# Patient Record
Sex: Female | Born: 1985 | Race: White | Hispanic: No | Marital: Single | Smoking: Never smoker
Health system: Southern US, Community
[De-identification: ages and names within clinical notes are randomized; demographics above are authoritative.]

## PROBLEM LIST (undated history)

## (undated) DIAGNOSIS — C259 Malignant neoplasm of pancreas, unspecified: Secondary | ICD-10-CM

## (undated) HISTORY — PX: HERNIA REPAIR: SHX51

## (undated) HISTORY — PX: WHIPPLE PROCEDURE: SHX2667

---
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
## 2011-07-30 ENCOUNTER — Other Ambulatory Visit: Payer: Self-pay

## 2011-08-29 ENCOUNTER — Other Ambulatory Visit (HOSPITAL_COMMUNITY): Payer: Self-pay | Admitting: Obstetrics and Gynecology

## 2011-08-29 DIAGNOSIS — Z3682 Encounter for antenatal screening for nuchal translucency: Secondary | ICD-10-CM

## 2011-09-19 ENCOUNTER — Other Ambulatory Visit (HOSPITAL_COMMUNITY): Payer: Self-pay

## 2011-09-24 ENCOUNTER — Other Ambulatory Visit (HOSPITAL_COMMUNITY): Payer: Self-pay

## 2011-09-25 ENCOUNTER — Encounter (HOSPITAL_COMMUNITY): Payer: Self-pay

## 2011-09-25 ENCOUNTER — Ambulatory Visit (HOSPITAL_COMMUNITY)
Admission: RE | Admit: 2011-09-25 | Discharge: 2011-09-25 | Disposition: A | Discharge status: Home / Self Care | Payer: Commercial Managed Care - PPO | Source: Ambulatory Visit | Attending: Obstetrics and Gynecology | Admitting: Obstetrics and Gynecology

## 2011-09-25 DIAGNOSIS — O351XX0 Maternal care for (suspected) chromosomal abnormality in fetus, not applicable or unspecified: Secondary | ICD-10-CM | POA: Insufficient documentation

## 2011-09-25 DIAGNOSIS — Z3682 Encounter for antenatal screening for nuchal translucency: Secondary | ICD-10-CM

## 2011-09-25 DIAGNOSIS — O3510X0 Maternal care for (suspected) chromosomal abnormality in fetus, unspecified, not applicable or unspecified: Secondary | ICD-10-CM | POA: Insufficient documentation

## 2011-09-25 DIAGNOSIS — Z3689 Encounter for other specified antenatal screening: Secondary | ICD-10-CM | POA: Insufficient documentation

## 2011-10-03 ENCOUNTER — Other Ambulatory Visit (HOSPITAL_COMMUNITY): Payer: Self-pay | Admitting: Obstetrics and Gynecology

## 2011-10-03 DIAGNOSIS — Z3689 Encounter for other specified antenatal screening: Secondary | ICD-10-CM

## 2011-10-30 ENCOUNTER — Other Ambulatory Visit: Payer: Self-pay

## 2011-11-05 ENCOUNTER — Ambulatory Visit (HOSPITAL_COMMUNITY): Payer: Commercial Managed Care - PPO

## 2011-11-21 ENCOUNTER — Other Ambulatory Visit (HOSPITAL_COMMUNITY): Payer: Self-pay | Admitting: Obstetrics and Gynecology

## 2011-11-21 ENCOUNTER — Ambulatory Visit (HOSPITAL_COMMUNITY)
Admission: RE | Admit: 2011-11-21 | Discharge: 2011-11-21 | Disposition: A | Discharge status: Home / Self Care | Payer: Commercial Managed Care - PPO | Source: Ambulatory Visit | Attending: Obstetrics and Gynecology | Admitting: Obstetrics and Gynecology

## 2011-11-21 DIAGNOSIS — O358XX0 Maternal care for other (suspected) fetal abnormality and damage, not applicable or unspecified: Secondary | ICD-10-CM | POA: Insufficient documentation

## 2011-11-21 DIAGNOSIS — Z1389 Encounter for screening for other disorder: Secondary | ICD-10-CM | POA: Insufficient documentation

## 2011-11-21 DIAGNOSIS — Z3689 Encounter for other specified antenatal screening: Secondary | ICD-10-CM

## 2011-11-21 DIAGNOSIS — Z363 Encounter for antenatal screening for malformations: Secondary | ICD-10-CM | POA: Insufficient documentation

## 2012-03-18 ENCOUNTER — Telehealth (HOSPITAL_COMMUNITY): Payer: Self-pay | Admitting: *Deleted

## 2012-03-18 NOTE — Telephone Encounter (Signed)
MFM pt not an anticipated del at [facility]

## 2012-06-26 ENCOUNTER — Ambulatory Visit
Admission: RE | Admit: 2012-06-26 | Discharge: 2012-06-26 | Disposition: A | Discharge status: Home / Self Care | Payer: No Typology Code available for payment source | Source: Ambulatory Visit | Attending: Infectious Diseases | Admitting: Infectious Diseases

## 2012-06-26 ENCOUNTER — Other Ambulatory Visit: Payer: Self-pay | Admitting: Infectious Diseases

## 2012-06-26 DIAGNOSIS — R7611 Nonspecific reaction to tuberculin skin test without active tuberculosis: Secondary | ICD-10-CM

## 2014-05-18 IMAGING — CR DG CHEST 1V
1 series · 1 of 1 positions shown · non-contrast
Comparison: None.

CLINICAL DATA: Positive PPD

CHEST - 1 VIEW

[view not recorded]
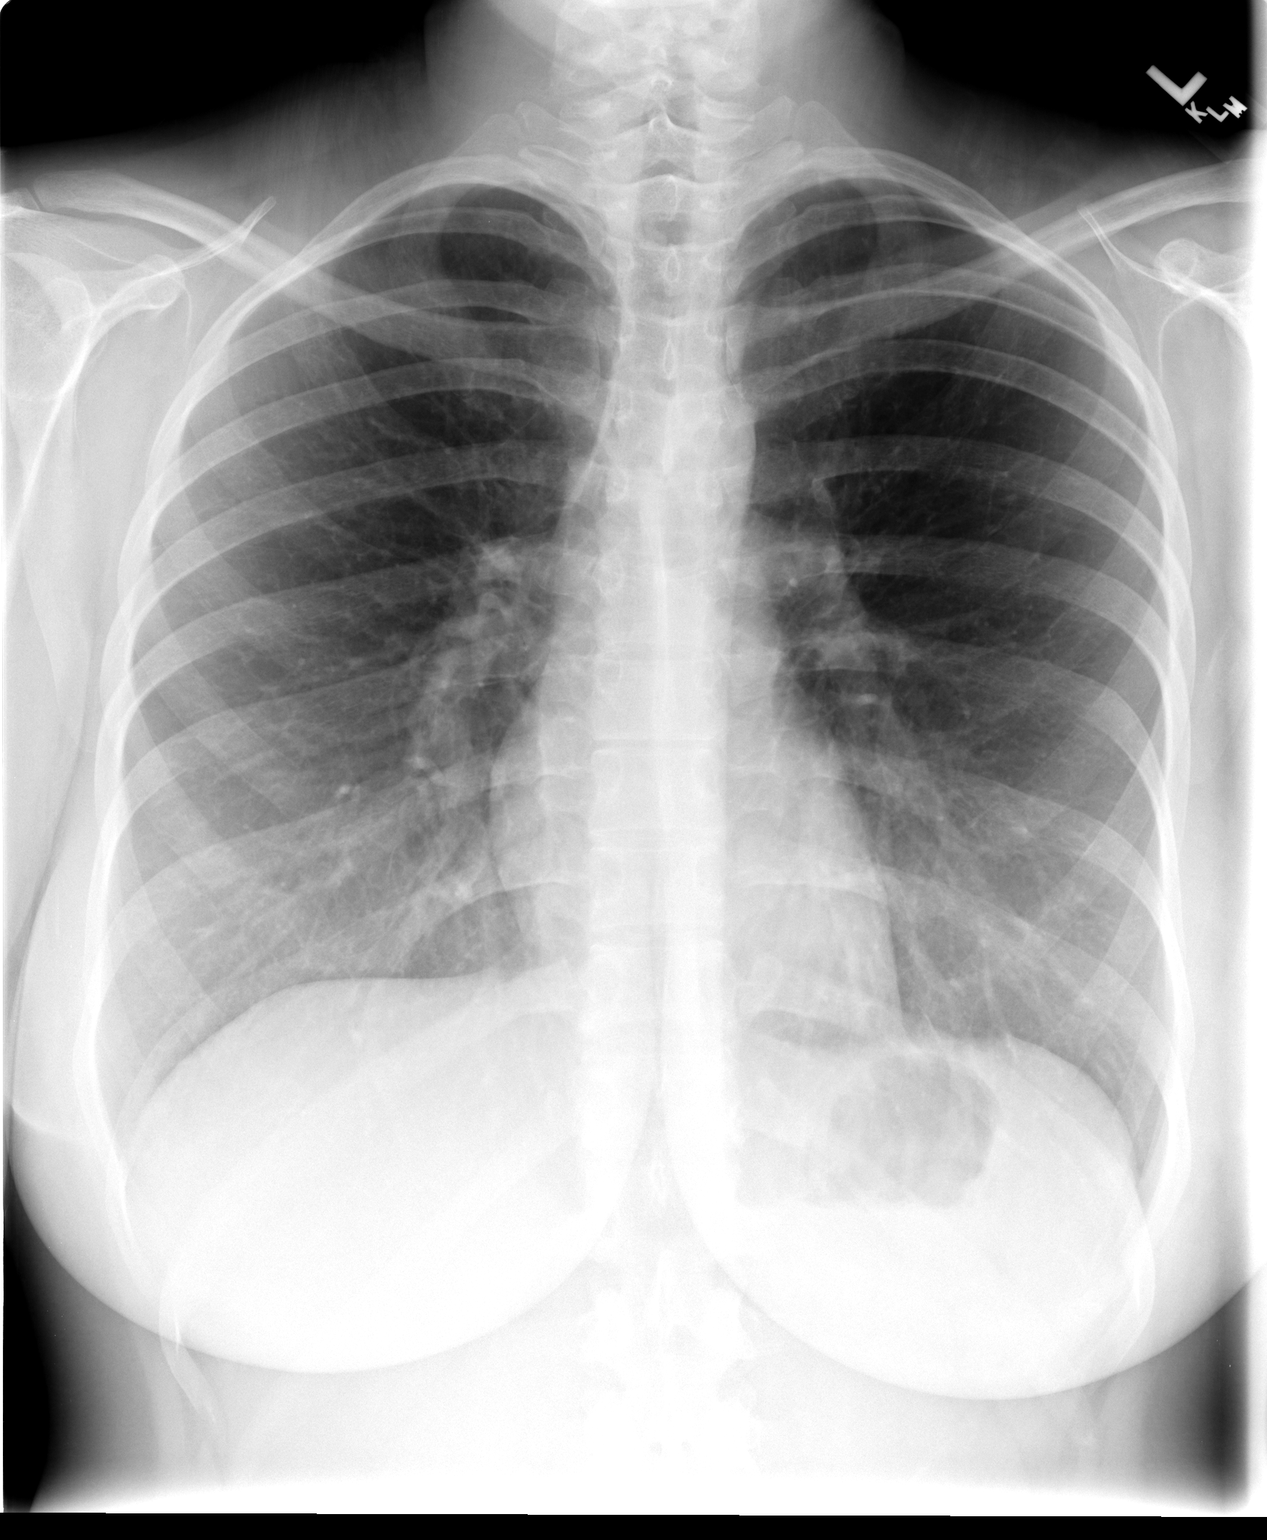

[1 of 1 positions shown; findings below may reference images not displayed]

FINDINGS: The lungs are clear.  No sequela of prior tuberculous
infection is seen.  Mediastinal contours appear normal.  The heart
is within normal limits in size.  No bony abnormality is seen.
IMPRESSION: No active lung disease.

## 2022-02-08 ENCOUNTER — Ambulatory Visit: Admission: EM | Admit: 2022-02-08 | Discharge: 2022-02-08 | Payer: BC Managed Care – PPO

## 2022-02-08 ENCOUNTER — Emergency Department (HOSPITAL_COMMUNITY): Payer: BC Managed Care – PPO

## 2022-02-08 ENCOUNTER — Other Ambulatory Visit: Payer: Self-pay

## 2022-02-08 ENCOUNTER — Emergency Department (HOSPITAL_COMMUNITY)
Admission: EM | Admit: 2022-02-08 | Discharge: 2022-02-08 | Disposition: A | Discharge status: Home / Self Care | Payer: BC Managed Care – PPO | Attending: Emergency Medicine | Admitting: Emergency Medicine

## 2022-02-08 ENCOUNTER — Encounter (HOSPITAL_COMMUNITY): Payer: Self-pay

## 2022-02-08 DIAGNOSIS — R1012 Left upper quadrant pain: Secondary | ICD-10-CM

## 2022-02-08 DIAGNOSIS — R110 Nausea: Secondary | ICD-10-CM | POA: Insufficient documentation

## 2022-02-08 DIAGNOSIS — R1032 Left lower quadrant pain: Secondary | ICD-10-CM | POA: Insufficient documentation

## 2022-02-08 DIAGNOSIS — Z8507 Personal history of malignant neoplasm of pancreas: Secondary | ICD-10-CM | POA: Diagnosis not present

## 2022-02-08 HISTORY — DX: Malignant neoplasm of pancreas, unspecified: C25.9

## 2022-02-08 LAB — URINALYSIS, ROUTINE W REFLEX MICROSCOPIC
Bilirubin Urine: NEGATIVE
Glucose, UA: NEGATIVE mg/dL
Hgb urine dipstick: NEGATIVE
Ketones, ur: NEGATIVE mg/dL
Leukocytes,Ua: NEGATIVE
Nitrite: NEGATIVE
Protein, ur: NEGATIVE mg/dL
Specific Gravity, Urine: 1.017 (ref 1.005–1.030)
pH: 6 (ref 5.0–8.0)

## 2022-02-08 LAB — COMPREHENSIVE METABOLIC PANEL
ALT: 29 U/L (ref 0–44)
AST: 36 U/L (ref 15–41)
Albumin: 4 g/dL (ref 3.5–5.0)
Alkaline Phosphatase: 116 U/L (ref 38–126)
Anion gap: 7 (ref 5–15)
BUN: 9 mg/dL (ref 6–20)
CO2: 26 mmol/L (ref 22–32)
Calcium: 8.7 mg/dL — ABNORMAL LOW (ref 8.9–10.3)
Chloride: 104 mmol/L (ref 98–111)
Creatinine, Ser: 0.54 mg/dL (ref 0.44–1.00)
GFR, Estimated: >60 mL/min (ref >60)
Glucose, Bld: 104 mg/dL — ABNORMAL HIGH (ref 70–99)
Potassium: 4 mmol/L (ref 3.5–5.1)
Sodium: 137 mmol/L (ref 135–145)
Total Bilirubin: 0.4 mg/dL (ref 0.3–1.2)
Total Protein: 7.6 g/dL (ref 6.5–8.1)

## 2022-02-08 LAB — CBC WITH DIFFERENTIAL/PLATELET
Abs Immature Granulocytes: 0.01 10*3/uL (ref 0.00–0.07)
Basophils Absolute: 0.1 10*3/uL (ref 0.0–0.1)
Basophils Relative: 1 %
Eosinophils Absolute: 0.3 10*3/uL (ref 0.0–0.5)
Eosinophils Relative: 5 %
HCT: 40.9 % (ref 36.0–46.0)
Hemoglobin: 12.9 g/dL (ref 12.0–15.0)
Immature Granulocytes: 0 %
Lymphocytes Relative: 35 %
Lymphs Abs: 2.1 10*3/uL (ref 0.7–4.0)
MCH: 26.2 pg (ref 26.0–34.0)
MCHC: 31.5 g/dL (ref 30.0–36.0)
MCV: 83 fL (ref 80.0–100.0)
Monocytes Absolute: 0.6 10*3/uL (ref 0.1–1.0)
Monocytes Relative: 10 %
Neutro Abs: 3 10*3/uL (ref 1.7–7.7)
Neutrophils Relative %: 49 %
Platelets: 290 10*3/uL (ref 150–400)
RBC: 4.93 MIL/uL (ref 3.87–5.11)
RDW: 13.9 % (ref 11.5–15.5)
WBC: 6.1 10*3/uL (ref 4.0–10.5)
nRBC: 0 % (ref 0.0–0.2)

## 2022-02-08 LAB — LIPASE, BLOOD: Lipase: 28 U/L (ref 11–51)

## 2022-02-08 LAB — PREGNANCY, URINE: Preg Test, Ur: NEGATIVE

## 2022-02-08 MED ORDER — IOHEXOL 300 MG/ML  SOLN
100.0000 mL | Freq: Once | INTRAMUSCULAR | Status: AC | PRN
  Administered 2022-02-08: 100 mL via INTRAVENOUS

## 2022-02-08 MED ORDER — LIDOCAINE 5 % EX PTCH
1.0000 | MEDICATED_PATCH | CUTANEOUS | Status: DC
  Administered 2022-02-08: 1 via TRANSDERMAL
  Filled 2022-02-08: qty 1

## 2022-02-08 MED ORDER — SODIUM CHLORIDE 0.9 % IV BOLUS
1000.0000 mL | Freq: Once | INTRAVENOUS | Status: AC
  Administered 2022-02-08: 1000 mL via INTRAVENOUS

## 2022-02-08 MED ORDER — FENTANYL CITRATE PF 50 MCG/ML IJ SOSY
50.0000 ug | PREFILLED_SYRINGE | Freq: Once | INTRAMUSCULAR | Status: AC
  Administered 2022-02-08: 50 ug via INTRAVENOUS
  Filled 2022-02-08: qty 1

## 2022-02-08 NOTE — Discharge Instructions (Addendum)
Your work-up today was reassuring.  Try taking Tylenol and Motrin for the pain.  No signs of an acute abdominal process, follow-up with your primary or the GI doctor in the next 2 weeks your symptoms persist. ?

## 2022-02-08 NOTE — ED Provider Notes (Signed)
?[place] ? ? ? ?CSN: [id number] ?Arrival date & time: 02/08/22  1722 ? ? ?  ? ?History   ?Chief Complaint ?Chief Complaint  ?Patient presents with  ? Abdominal Pain  ? ? ?HPI ?ALEXANDR Rodgers is a 36 y.o. female presenting with left-sided abdominal pain.  History of pancreatic cancer and left-sided abdominal hernia related to surgical incision, previously repaired with mesh.  Describes 4 days of sharp and stabbing left sided abdominal pain, worse in the upper quadrant.  Pain radiates to the umbilicus.  Tolerable at rest but worse with movement.  Denies constipation, but does endorse intermittent nausea and vomiting.  States normal stool and passing gas regularly. ? ?HPI ? ?History reviewed. No pertinent past medical history. ? ?There are no problems to display for this patient. ? ? ?History reviewed. No pertinent surgical history. ? ?OB History   ? ? Gravida  ?1  ? Para  ?0  ? Term  ?0  ? Preterm  ?0  ? AB  ?0  ? Living  ?0  ?  ? ? SAB  ?0  ? IAB  ?0  ? Ectopic  ?0  ? Multiple  ?0  ? Live Births  ?   ?   ?  ?  ? ? ? ?Home Medications   ? ?Prior to Admission medications   ?Medication Sig Start Date End Date Taking? Authorizing Provider  ?Ondansetron HCl (ZOFRAN PO) Take by mouth.      [provider]  ? ? ?Family History ?No family history on file. ? ?Social History ?Social History  ? ?Tobacco Use  ? Smoking status: Never  ?  Passive exposure: Never  ? Smokeless tobacco: Never  ?Vaping Use  ? Vaping Use: Never used  ?Substance Use Topics  ? Alcohol use: Never  ? Drug use: Never  ? ? ? ?Allergies   ?Aspirin, Benadryl [diphenhydramine hcl], Penicillins, and Shellfish-derived products ? ? ?Review of Systems ?Review of Systems  ?Constitutional:  Negative for appetite change, chills, diaphoresis, fever and unexpected weight change.  ?HENT:  Negative for congestion, ear pain, sinus pressure, sinus pain, sneezing, sore throat and trouble swallowing.   ?Respiratory:  Negative for cough, chest tightness  and shortness of breath.   ?Cardiovascular:  Negative for chest pain.  ?Gastrointestinal:  Positive for abdominal pain. Negative for abdominal distention, anal bleeding, blood in stool, constipation, diarrhea, nausea, rectal pain and vomiting.  ?Genitourinary:  Negative for dysuria, flank pain, frequency and urgency.  ?Musculoskeletal:  Negative for back pain and myalgias.  ?Neurological:  Negative for dizziness, light-headedness and headaches.  ?All other systems reviewed and are negative. ? ? ?Physical Exam ?Triage Vital Signs ?ED Triage Vitals  ?Enc Vitals Group  ?   BP 02/08/22 1758 108/74  ?   Pulse Rate 02/08/22 1758 70  ?   Resp 02/08/22 1758 18  ?   Temp 02/08/22 1758 98.4 ?F (36.9 ?C)  ?   Temp Source 02/08/22 1758 Oral  ?   SpO2 02/08/22 1758 98 %  ?   Weight --   ?   Height --   ?   Head Circumference --   ?   Peak Flow --   ?   Pain Score 02/08/22 1756 6  ?   Pain Loc --   ?   Pain Edu? --   ?   Excl. in [place]? --   ? ?No data found. ? ?Updated Vital Signs ?BP 108/74 (BP Location: Right Arm)  Pulse 70   Temp 98.4 ?F (36.9 ?C) (Oral)   Resp 18   LMP 01/30/2022 (Exact Date)   SpO2 98%   Breastfeeding No  ? ?Visual Acuity ?Right Eye Distance:   ?Left Eye Distance:   ?Bilateral Distance:   ? ?Right Eye Near:   ?Left Eye Near:    ?Bilateral Near:    ? ?Physical Exam ?Vitals reviewed.  ?Constitutional:   ?   General: She is not in acute distress. ?   Appearance: Normal appearance. She is not ill-appearing.  ?HENT:  ?   Head: Normocephalic and atraumatic.  ?   Mouth/Throat:  ?   Mouth: Mucous membranes are moist.  ?   Comments: Moist mucous membranes ?Eyes:  ?   Extraocular Movements: Extraocular movements intact.  ?   Pupils: Pupils are equal, round, and reactive to light.  ?Cardiovascular:  ?   Rate and Rhythm: Normal rate and regular rhythm.  ?   Heart sounds: Normal heart sounds.  ?Pulmonary:  ?   Effort: Pulmonary effort is normal.  ?   Breath sounds: Normal breath sounds. No wheezing, rhonchi or  rales.  ?Abdominal:  ?   General: Bowel sounds are normal. There is no distension.  ?   Palpations: Abdomen is soft. There is no mass.  ?   Tenderness: There is abdominal tenderness in the left upper quadrant and left lower quadrant. There is no right CVA tenderness, left CVA tenderness, guarding or rebound.  ?   Comments: Significantly tender to palpation LUQ and LLQ and umbilicus. No obvious mass, hernia. BS positive throughout.   ?Skin: ?   General: Skin is warm.  ?   Capillary Refill: Capillary refill takes less than 2 seconds.  ?   Comments: Good skin turgor  ?Neurological:  ?   General: No focal deficit present.  ?   Mental Status: She is alert and oriented to person, place, and time.  ?Psychiatric:     ?   Mood and Affect: Mood normal.     ?   Behavior: Behavior normal.  ? ? ? ?UC Treatments / Results  ?Labs ?(all labs ordered are listed, but only abnormal results are displayed) ?Labs Reviewed - No data to display ? ?EKG ? ? ?Radiology ?No results found. ? ?Procedures ?Procedures (including critical care time) ? ?Medications Ordered in UC ?Medications - No data to display ? ?Initial Impression / Assessment and Plan / UC Course  ?I have reviewed the triage vital signs and the nursing notes. ? ?Pertinent labs & imaging results that were available during my care of the patient were reviewed by me and considered in my medical decision making (see chart for details). ? ?  ? ?This patient is a very pleasant 36 y.o. year old female presenting with severe left-sided abd pain. Afebrile, nontachy. History of pancreatic cancer s/p Whipple procedure, and hernia repaired with mesh. As we cannot perform abd imaging at this urgent care, sent to ED via personal vehicle.   ? ?Final Clinical Impressions(s) / UC Diagnoses  ? ?Final diagnoses:  ?Left upper quadrant abdominal pain  ? ? ? ?Discharge Instructions   ? ?  ?-Please head to the ED for further evaluation and management including imaging that we cannot perform at urgent  care.  ? ? ?ED Prescriptions   ?None ?  ? ?PDMP not reviewed this encounter. ?  ?Hazel Sams, PA-C ?02/08/22 1839 ? ?

## 2022-02-08 NOTE — ED Triage Notes (Signed)
Pt states that about 4 days ago she started having pain on the left side of her abdomin ? ?Pt states it is a sharp, stabbing feeling. Pt states this is the feeling she had after a repair from her Lipo surgery with mesh ? ?Pt states she has been taking Tylenol for pain ?

## 2022-02-08 NOTE — Discharge Instructions (Addendum)
-  Please head to the ED for further evaluation and management including imaging that we cannot perform at urgent care.  ?

## 2022-02-08 NOTE — ED Triage Notes (Signed)
Reports LLQ pain x 4 days.  No relief with tylenol.  Intermittent nausea denies v/d or urinary symptoms.  ?

## 2022-02-08 NOTE — ED Notes (Signed)
Patient is being discharged from the Urgent Care and sent to the Emergency Department via private vehicle . Per PA, patient is in need of higher level of care due to ABD pain. Patient is aware and verbalizes understanding of plan of care.  ?Vitals:  ? 02/08/22 1758  ?BP: 108/74  ?Pulse: 70  ?Resp: 18  ?Temp: 98.4 ?F (36.9 ?C)  ?SpO2: 98%  ? ? ?

## 2022-02-08 NOTE — ED Provider Notes (Signed)
?[place] ?Provider Note ? ? ?CSN: [id number] ?Arrival date & time: 02/08/22  1839 ? ?  ? ?History ? ?Chief Complaint  ?Patient presents with  ? Abdominal Pain  ? ? ?Barbara Rodgers is a 36 y.o. female. ? ? ?Abdominal Pain ? ?Patient with medical history notable for pancreatic carcinoma, hernia repair and Whipple procedure presents today with left-sided abdominal pain.  Started 4 days ago, worse in the lower quadrant but also in the upper quadrant.  It is constant but the severity changes and is worsened when she moves.  Associate with nausea but no vomiting, denies any dysuria hematuria vaginal discharge.  Was seen in urgent care and advised to go to ED for more thorough evaluation.  She is cancer free and not currently under treatment.  Was seen at [facility] for procedure. ? ?Home Medications ?Prior to Admission medications   ?Medication Sig Start Date End Date Taking? Authorizing Provider  ?Ondansetron HCl (ZOFRAN PO) Take by mouth.      [provider]  ?   ? ?Allergies    ?Aspirin, Benadryl [diphenhydramine hcl], Penicillins, and Shellfish-derived products   ? ?Review of Systems   ?Review of Systems  ?Gastrointestinal:  Positive for abdominal pain.  ? ?Physical Exam ?Updated Vital Signs ?BP 103/67 (BP Location: Left Arm)   Pulse 69   Temp 98 ?F (36.7 ?C) (Oral)   Resp 17   Ht '5\' 4"'$  (1.626 m)   Wt 67.1 kg   LMP 01/30/2022 (Exact Date)   SpO2 99%   BMI 25.40 kg/m?  ?Physical Exam ?Vitals and nursing note reviewed. Exam conducted with a chaperone present.  ?Constitutional:   ?   General: She is not in acute distress. ?   Appearance: Normal appearance.  ?HENT:  ?   Head: Normocephalic and atraumatic.  ?Eyes:  ?   General: No scleral icterus. ?   Extraocular Movements: Extraocular movements intact.  ?   Pupils: Pupils are equal, round, and reactive to light.  ?Abdominal:  ?   Tenderness: There is abdominal tenderness in the left upper quadrant and left lower quadrant.  ?Skin: ?    Coloration: Skin is not jaundiced.  ?Neurological:  ?   Mental Status: She is alert. Mental status is at baseline.  ?   Coordination: Coordination normal.  ? ? ?ED Results / Procedures / Treatments   ?Labs ?(all labs ordered are listed, but only abnormal results are displayed) ?Labs Reviewed  ?COMPREHENSIVE METABOLIC PANEL - Abnormal; Notable for the following components:  ?    Result Value  ? Glucose, Bld 104 (*)   ? Calcium 8.7 (*)   ? All other components within normal limits  ?CBC WITH DIFFERENTIAL/PLATELET  ?LIPASE, BLOOD  ?URINALYSIS, ROUTINE W REFLEX MICROSCOPIC  ?PREGNANCY, URINE  ? ? ?EKG ?None ? ?Radiology ?CT Abdomen Pelvis W Contrast ? ?Result Date: 02/08/2022 ?CLINICAL DATA:  Left lower quadrant abdominal pain for 4 days, intermittent nausea EXAM: CT ABDOMEN AND PELVIS WITH CONTRAST TECHNIQUE: Multidetector CT imaging of the abdomen and pelvis was performed using the standard protocol following bolus administration of intravenous contrast. RADIATION DOSE REDUCTION: This exam was performed according to the departmental dose-optimization program which includes automated exposure control, adjustment of the mA and/or kV according to patient size and/or use of iterative reconstruction technique. CONTRAST:  177m OMNIPAQUE IOHEXOL 300 MG/ML  SOLN COMPARISON:  None. FINDINGS: Lower chest: No acute pleural or parenchymal lung disease. Hepatobiliary: Cholecystectomy. The liver is unremarkable. No biliary duct  dilation. Pancreas: Previous pancreatic head resection. Remaining portions of the pancreas are unremarkable. No duct dilation. Spleen: Normal in size without focal abnormality. Adrenals/Urinary Tract: The kidneys enhance normally and symmetrically. No urinary tract calculi or obstructive uropathy. The adrenals are unremarkable. Bladder is decompressed, limiting its evaluation. Stomach/Bowel: Postsurgical changes from distal gastrectomy and duodenectomy. Gastrojejunostomy left upper quadrant. No bowel  obstruction or ileus. Normal appendix right lower quadrant. No bowel wall thickening or inflammatory change. Vascular/Lymphatic: No significant vascular findings are present. No enlarged abdominal or pelvic lymph nodes. Reproductive: Uterus is retroverted.  No adnexal masses. Other: There is a small amount of free fluid within the lower pelvis, which may be physiologic. No free intraperitoneal gas. Evidence of prior ventral hernia repair at the level the umbilicus and left mid abdomen, with likely mesh within the ventral left hemiabdomen. Minimal fluid along the ventral mash, measuring 7 mm reference image 43/2. Small amount of simple fluid at the level of the umbilicus measures 1.4 x 1.8 cm. No rim enhancement to suggest abscess. Musculoskeletal: No acute or destructive bony lesions. Reconstructed images demonstrate no additional findings. IMPRESSION: 1. Small amount of pelvic free fluid, likely physiologic. 2. Evidence of prior ventral/umbilical hernia repair, with apparent surgical mesh in place. A small amount of simple appearing fluid along the mash and at the level the umbilicus. No rim enhancement to suggest abscess. 3. Postsurgical changes from cholecystectomy, distal gastrectomy, duodenectomy, and partial pancreatectomy. Electronically Signed   By: Randa Ngo M.D.   On: 02/08/2022 20:33   ? ?Procedures ?Procedures  ? ? ?Medications Ordered in ED ?Medications  ?lidocaine (LIDODERM) 5 % 1 patch (1 patch Transdermal Patch Applied 02/08/22 2133)  ?sodium chloride 0.9 % bolus 1,000 mL (0 mLs Intravenous Stopped 02/08/22 2036)  ?fentaNYL (SUBLIMAZE) injection 50 mcg (50 mcg Intravenous Given 02/08/22 1913)  ?iohexol (OMNIPAQUE) 300 MG/ML solution 100 mL (100 mLs Intravenous Contrast Given 02/08/22 2015)  ? ? ?ED Course/ Medical Decision Making/ A&P ?  ?                        ?Medical Decision Making ?Amount and/or Complexity of Data Reviewed ?Labs: ordered. ?Radiology: ordered. ? ?Risk ?Prescription drug  management. ? ? ?This is a 36 year old female with past medical history of pancreatic carcinoma contributing to her current presentation.  Additionally she has previous surgeries including hernia repair and Whipple procedure.  On exam she has left sided abdominal tenderness which is worse on the lower quadrant.  Her abdomen is soft without peritoneal signs.  I reviewed the note from [place] for evolving her surgical history, also reviewed note from urgent care provider prior to arrival.  Patient medical history and there is concerned about diverticulitis or worsening acute abdominal process. ? ?I ordered and personally viewed and interpreted the laboratory work-up.  Patient is not pregnant, urine is unremarkable.  Lipase within normal limits, no leukocytosis or significant anemia.  There is no gross electrolyte derangement or AKI noted.  I also ordered and reviewed CT abdomen.  I agree with radiologist interpretation involving surgical findings and no acute process noted. ? ?Discussed with the patient, given the pain is worsened by movement I do have a high suspicion this could be a muscle related injury.  We did discuss strict return precautions, patient states she would like to go home.  Given she is hemodynamically stable with improvement of pain I do not feel she needs to be admitted.  Patient was discharged in  stable condition ? ? ? ? ? ? ? ?Final Clinical Impression(s) / ED Diagnoses ?Final diagnoses:  ?Left lower quadrant abdominal pain  ? ? ?Rx / DC Orders ?ED Discharge Orders   ? ? None  ? ?  ? ? ?  ?Sherrill Raring, PA-C ?02/08/22 2255 ? ?  ?Daleen Bo, MD ?02/08/22 2331 ? ?

## 2022-02-15 ENCOUNTER — Encounter: Payer: Self-pay | Admitting: Physician Assistant

## 2022-02-15 ENCOUNTER — Ambulatory Visit: Payer: BC Managed Care – PPO | Admitting: Physician Assistant

## 2022-02-15 VITALS — BP 121/82 | Temp 97.7°F | Ht 64.0 in | Wt 147.8 lb

## 2022-02-15 DIAGNOSIS — R1032 Left lower quadrant pain: Secondary | ICD-10-CM

## 2022-02-15 DIAGNOSIS — C259 Malignant neoplasm of pancreas, unspecified: Secondary | ICD-10-CM | POA: Insufficient documentation

## 2022-02-15 NOTE — Progress Notes (Signed)
? ?Subjective:  ? ? Patient ID: Barbara Rodgers, female    DOB: [birth year], 36 y.o.   MRN: [id number] ? ?Chief Complaint  ?Patient presents with  ? Abdominal Pain  ?  Pt is new pt establishing care, recently seen in ED for abdominal pain for aprox. 10 days; pain in ULQ and LLQ of abdomen; pt had recent pap and completing for to receive records  ? ? ?HPI ?36 y.o. patient with hx pancreatic carcinoma presents today for new patient establishment with me.  Patient was previously established with Dr. Ouida Sills in [place]. Here with her 36 yo son, Barbara Rodgers.  ? ?Current Care Team: ?[facility] - Dr. Brigitte Pulse ?[facility] ?[facility] ? ?Acute Concerns: ?LUQ into LLQ (worse) pain (3/10 today) ?-Started 02/04/22, no known injury ?-Went to [facility] ED 02/08/22 when pain was more severe; work-up negative (7/10 pain at that time)  ?-Alternating Tylenol / Motrin, which does help the pain  ?-Has been waking her up out of her sleep  ?-Aggravated more with movement ?-Doesn't seem to change with food or fasting  ?-Dull ache throughout the day, sharp stabbing pain occasionally ?-Sometimes associate nausea ?-Sitting reclined helps, as well as not heavy lifting  ?-No vomiting. Normal BM's. No pain with urination.  ? ?Tried Tums and Gas-X. Stopped Adderall and Prilosec. Tried broth daily. ? ?Periods are monthly, usually only last 1-2 days, started to lessen from 2020. LMP 3/15. She has had a right ovarian cyst in the past.  ? ?Whipple Procedure in 2020 - took out head of pancreas, gallbladder, part of stomach, bile ducts ?2022- Hernia repair at incision site ? ?Appt Monday 02/18/22 with surgical oncologist ? ?April 18th is her regular 6 month f/up with oncology ? ? ?Past Medical History:  ?Diagnosis Date  ? Pancreatic carcinoma ([place])   ? ? ?Past Surgical History:  ?Procedure Laterality Date  ? HERNIA REPAIR    ? 2022- Hernia repair at incision site  ? WHIPPLE PROCEDURE    ? 2020 took out head of pancreas, gallbladder, part of  stomach, bile ducts  ? ? ?Family History  ?Problem Relation Age of Onset  ? Anxiety disorder Mother   ? Hypertension Maternal Grandmother   ? ? ?Social History  ? ?Tobacco Use  ? Smoking status: Never  ?  Passive exposure: Never  ? Smokeless tobacco: Never  ?Vaping Use  ? Vaping Use: Never used  ?Substance Use Topics  ? Alcohol use: Never  ? Drug use: Never  ?  ? ?Allergies  ?Allergen Reactions  ? Aspirin   ? Benadryl [Diphenhydramine Hcl]   ? Penicillins   ? Shellfish-Derived Products   ? ? ?Review of Systems ?NEGATIVE UNLESS OTHERWISE INDICATED IN HPI ? ? ?   ?Objective:  ?  ? ?BP 121/82 (BP Location: Left Arm)   Temp 97.7 ?F (36.5 ?C) (Temporal)   Ht '5\' 4"'$  (1.626 m)   Wt 147 lb 12.8 oz (67 kg)   LMP 01/30/2022 (Exact Date)   BMI 25.37 kg/m?  ? ?Wt Readings from Last 3 Encounters:  ?02/15/22 147 lb 12.8 oz (67 kg)  ?02/08/22 148 lb (67.1 kg)  ?09/25/11 148 lb (67.1 kg)  ? ? ?BP Readings from Last 3 Encounters:  ?02/15/22 121/82  ?02/08/22 103/67  ?02/08/22 108/74  ?  ? ?Physical Exam ?Vitals and nursing note reviewed.  ?Constitutional:   ?   Appearance: Normal appearance. She is normal weight. She is not toxic-appearing.  ?HENT:  ?  Head: Normocephalic and atraumatic.  ?   Right Ear: Tympanic membrane, ear canal and external ear normal.  ?   Left Ear: Tympanic membrane, ear canal and external ear normal.  ?   Nose: Nose normal.  ?   Mouth/Throat:  ?   Mouth: Mucous membranes are moist.  ?Eyes:  ?   Extraocular Movements: Extraocular movements intact.  ?   Conjunctiva/sclera: Conjunctivae normal.  ?   Pupils: Pupils are equal, round, and reactive to light.  ?Cardiovascular:  ?   Rate and Rhythm: Normal rate and regular rhythm.  ?   Pulses: Normal pulses.  ?   Heart sounds: Normal heart sounds.  ?Pulmonary:  ?   Effort: Pulmonary effort is normal.  ?   Breath sounds: Normal breath sounds.  ?Abdominal:  ?   General: Abdomen is flat. Bowel sounds are normal.  ?   Palpations: Abdomen is soft.  ?   Tenderness:  There is abdominal tenderness in the suprapubic area and left lower quadrant. There is guarding. There is no right CVA tenderness, left CVA tenderness or rebound. Negative signs include Rovsing's sign.  ?Musculoskeletal:     ?   General: Normal range of motion.  ?   Cervical back: Normal range of motion and neck supple.  ?Skin: ?   General: Skin is warm and dry.  ?Neurological:  ?   General: No focal deficit present.  ?   Mental Status: She is alert and oriented to person, place, and time.  ?Psychiatric:     ?   Mood and Affect: Mood normal.     ?   Behavior: Behavior normal.     ?   Thought Content: Thought content normal.     ?   Judgment: Judgment normal.  ? ? ?   ?Assessment & Plan:  ? ?Problem List Items Addressed This Visit   ? ?  ? Digestive  ? Pancreatic carcinoma ([place])  ? ?Other Visit Diagnoses   ? ? Left lower quadrant abdominal pain    -  Primary  ? ?  ? ? ?1. Left lower quadrant abdominal pain ?2. Pancreatic carcinoma ([place]) ?-Pleasant 36 yo new patient establishment with hx pancreatic carcinoma and whipple procedure. ?-New LLQ abdominal pain over the last 10 days that is improving. ?-I personally reviewed ED labs, imaging, records from 02/08/22 ?-She will f/up with surgical onc on Monday. Happy to help with any additional labs or imaging they think might be helpful in this situation. Discussed with pt it's possible she had an ovarian cyst that may have ruptured and is resolving. Less likely MSK strain due to no injuries or heavy lifting.  ?-Pt to keep me updated how she's doing.   ?-Return to ED precautions discussed ? ?Regular f/up with me in 6 months or prn  ? ?Lakenzie Mcclafferty M Keelee Yankey, PA-C ?

## 2022-02-19 ENCOUNTER — Encounter: Payer: Self-pay | Admitting: Physician Assistant

## 2022-02-26 NOTE — Telephone Encounter (Signed)
I have faxed form over to [employer].  Sent copy to scan. Original placed up front for patient to pick up.  I have left patient a VM in regard.

## 2022-04-29 ENCOUNTER — Encounter: Payer: Self-pay | Admitting: Physician Assistant

## 2022-04-29 ENCOUNTER — Telehealth (INDEPENDENT_AMBULATORY_CARE_PROVIDER_SITE_OTHER): Payer: BC Managed Care – PPO | Admitting: Physician Assistant

## 2022-04-29 DIAGNOSIS — F4310 Post-traumatic stress disorder, unspecified: Secondary | ICD-10-CM

## 2022-04-29 DIAGNOSIS — F419 Anxiety disorder, unspecified: Secondary | ICD-10-CM | POA: Diagnosis not present

## 2022-04-29 DIAGNOSIS — F902 Attention-deficit hyperactivity disorder, combined type: Secondary | ICD-10-CM

## 2022-04-29 NOTE — Progress Notes (Signed)
Virtual Visit via Video Note  I connected with  Barbara Rodgers  on 04/29/22 at  9:00 AM EDT by a video enabled telemedicine application and verified that I am speaking with the correct person using two identifiers.  Location: Patient: home Provider: Therapist, music at [place] present: Patient and myself   I discussed the limitations of evaluation and management by telemedicine and the availability of in person appointments. The patient expressed understanding and agreed to proceed.   History of Present Illness:  36 year old female patient with history of pancreatic carcinoma presents for virtual video visit to discuss current concerns with anxiety, stress, other symptoms going on.  She states that she wanted to have her general practitioner involved in the what is going on as well.  She has been doing EMDR therapy with [facility] and states is bringing out some past PTSD.  She says this has been really good for her as she is walking through multiple traumas in her past, but says it has also been very difficult and is causing some increased stress and anxiety as well as restlessness at night.  She has been working with [employer] out of [place] for anxiety. Taking Clonazepam 0.5 mg at night to sleep off and on since surgery in 2020. Prior to surgery she was on higher doses because she was worried about the news of having cancer.  Patient states that recently she has been noticing more word-finding, memory lapses, difficulty spelling common words. More forgetfulness. Focus is off. Stopped taking adderall b/c thought anxiety was worse with it. She is trying to find more natural remedies to help with focus.   Recently found out dad has heart aneurysm and this is worrying her. Son and herself were sick about one week ago, which has worsened symptoms.  [facility] suggested Lyrica and Ritalin.  She is not sure how she is feeling about that.  A big trigger for her  is her mother has a history of drug abuse.  She does not want to end up going down that path.  She is already concerned about herself being on Klonopin for so long and would like to get off of this medication in time.  Tried ashwaghanda the last two nights. Not much changes. Alpha brain supplement for memory and focus  Last night woke up several times due to anxiety / PTSD that was brought out from EMDR. Usually sleeps between 6-7 hours per night.   She has tried a few other medication options over the years - prozac, wellbutrin, hydroxyzine, trazodone, but had several side effects.   Propranolol prn currently - very situational.   Auditory hallucinations when under great amounts of stress - again, has been going on since surgery.  [facility] and counselor is aware.  Only happens when indoors, gets better outdoors. Both ears affected.  Denies headaches other than related to allergies or illness. Very rare orthostatic dizziness. No tinnitus or hearing loss.   MRI brain w/wo contrast 01/01/21 negative.   Patient denies any other concerns or symptoms today.   Observations/Objective:   Gen: Awake, alert, no acute distress; somewhat tired appearing Resp: Breathing is even and non-labored Psych: calm/pleasant demeanor Neuro: Alert and Oriented x 3, + facial symmetry, speech is clear.   Assessment and Plan:  1. Stress disorder, post traumatic 2. Anxiety I personally reviewed patient's MRI of the brain from 01/01/2021, which was negative.  This was very reassuring.  I do not see any focal  neurologic deficits or concerns that would warrant a repeat of this image at this time.  Reassured patient that I do agree and think that EMDR is good, but putting extra stress on the brain right now, which could be contributing to her symptoms.  She also stopped the Adderall, which could be a big source of some of the symptoms she is experiencing as well.  I suggested for her:  -Trial ashwaghanda for about  one week; then may try magnesium or valerian root if no improvement with ashwaghanda  -Refer for 2nd opinion psychiatry in [place] -She may need to consider trial of different medication for ADHD relief  -De-stress as much as possible - walk, be outside in nature, pray, journal, drink warm tea  Recheck as scheduled in Aug.   Follow Up Instructions:    I discussed the assessment and treatment plan with the patient. The patient was provided an opportunity to ask questions and all were answered. The patient agreed with the plan and demonstrated an understanding of the instructions.   The patient was advised to call back or seek an in-person evaluation if the symptoms worsen or if the condition fails to improve as anticipated.  Time Spent: 39 minutes of total time was spent on the date of the encounter performing the following actions: chart review prior to seeing the patient, obtaining history, performing a medically necessary exam, counseling on the treatment plan, placing orders, and documenting in our EHR.    Marchetta Navratil M Mariaclara Spear, PA-C

## 2022-04-29 NOTE — Patient Instructions (Signed)
-  Trial ashwaghanda for about one week; then may try magnesium or valerian root -Refer for 2nd opinion psychiatry in [place] as much as possible - walk, be outside in nature, pray, journal, drink warm tea

## 2022-07-05 ENCOUNTER — Other Ambulatory Visit: Payer: Self-pay | Admitting: Otolaryngology

## 2022-07-05 ENCOUNTER — Other Ambulatory Visit: Payer: Self-pay | Admitting: Family Medicine

## 2022-07-05 ENCOUNTER — Other Ambulatory Visit: Payer: Self-pay | Admitting: Family

## 2022-07-05 DIAGNOSIS — C259 Malignant neoplasm of pancreas, unspecified: Secondary | ICD-10-CM

## 2022-07-15 ENCOUNTER — Encounter: Payer: BC Managed Care – PPO | Admitting: Physician Assistant

## 2022-08-12 ENCOUNTER — Encounter: Payer: Self-pay | Admitting: *Deleted

## 2022-08-16 ENCOUNTER — Other Ambulatory Visit: Payer: Self-pay | Admitting: Family Medicine

## 2022-08-16 ENCOUNTER — Ambulatory Visit
Admission: RE | Admit: 2022-08-16 | Discharge: 2022-08-16 | Disposition: A | Discharge status: Home / Self Care | Payer: BC Managed Care – PPO | Source: Ambulatory Visit | Attending: Otolaryngology | Admitting: Otolaryngology

## 2022-08-16 ENCOUNTER — Other Ambulatory Visit: Payer: Self-pay | Admitting: Family

## 2022-08-16 ENCOUNTER — Other Ambulatory Visit (HOSPITAL_COMMUNITY): Payer: Self-pay | Admitting: Family

## 2022-08-16 DIAGNOSIS — C259 Malignant neoplasm of pancreas, unspecified: Secondary | ICD-10-CM

## 2022-08-16 MED ORDER — IOPAMIDOL (ISOVUE-300) INJECTION 61%
100.0000 mL | Freq: Once | INTRAVENOUS | Status: AC | PRN
  Administered 2022-08-16: 100 mL via INTRAVENOUS

## 2022-09-13 ENCOUNTER — Encounter: Payer: BC Managed Care – PPO | Admitting: Physician Assistant

## 2022-10-14 ENCOUNTER — Encounter: Payer: Self-pay | Admitting: Family

## 2022-10-14 ENCOUNTER — Ambulatory Visit (INDEPENDENT_AMBULATORY_CARE_PROVIDER_SITE_OTHER): Payer: BC Managed Care – PPO | Admitting: Family

## 2022-10-14 VITALS — BP 116/62 | HR 69 | Temp 98.2°F | Ht 64.0 in | Wt 149.1 lb

## 2022-10-14 DIAGNOSIS — J029 Acute pharyngitis, unspecified: Secondary | ICD-10-CM

## 2022-10-14 DIAGNOSIS — U071 COVID-19: Secondary | ICD-10-CM

## 2022-10-14 LAB — POC COVID19 BINAXNOW: SARS Coronavirus 2 Ag: POSITIVE — AB

## 2022-10-14 LAB — POCT RAPID STREP A (OFFICE): Rapid Strep A Screen: NEGATIVE

## 2022-10-14 LAB — POCT INFLUENZA A/B
Influenza A, POC: NEGATIVE
Influenza B, POC: NEGATIVE

## 2022-10-14 MED ORDER — BENZONATATE 200 MG PO CAPS: 200.0000 mg | ORAL_CAPSULE | Freq: Three times a day (TID) | ORAL | 0 refills | Status: AC | PRN

## 2022-10-14 NOTE — Progress Notes (Signed)
Patient ID: Barbara Rodgers, female    DOB: March 02, 1986, 36 y.o.   MRN: [id number]  Chief Complaint  Patient presents with   Cough    sx for 4d    HPI:      Cough & sinus sx:  Pt c/o Cough, headache fever of 103.0, Sore throat, Nasal congestion, Low Bp of 95/58 and dizziness/almost fainting last night. Has tried fluids,mucinex and ibuprofen. Blood in mucus this morning. Present for about 4 days. Reports having covid last summer and Paxlovid caused bad GI symptoms.   Assessment & Plan:  1. Sore throat - rapid strep neg. Advised on taking  Ibuprofen '600mg'$  3 times per day for sore throat pain, swelling, and fever. Gargle with warm salt water several times per day. OK to use over the counter Chloraseptic spray and/or throat lozenges as needed.   - POCT rapid strep A  2. COVID-19 - rapid flu neg, covid positive. pt w/sx x4d, discussed w/pt antiviral may not be effective, hx of bad reaction to Paxlovid, pt prefers to not take Molnupiravir.  Sending Tessalon pearles for cough, pt w/near-syncopal episode last evening d/t dehydration, low BP, advised on importance of fluids, small sips throughout the day, including broth/soup as tolerated, plain crackers/toast until feeling better.  - POC COVID-19 - POCT Influenza A/B - benzonatate (TESSALON) 200 MG capsule; Take 1 capsule (200 mg total) by mouth 3 (three) times daily as needed for up to 10 days for cough.  Dispense: 30 capsule; Refill: 0    Subjective:    Outpatient Medications Prior to Visit  Medication Sig Dispense Refill   propranolol (INDERAL) 10 MG tablet Take 10 mg by mouth as needed.     clonazePAM (KLONOPIN) 0.5 MG tablet Take 0.5 mg by mouth at bedtime.     No facility-administered medications prior to visit.   Past Medical History:  Diagnosis Date   Pancreatic carcinoma [facility])    Past Surgical History:  Procedure Laterality Date   HERNIA REPAIR     2022- Hernia repair at incision site   WHIPPLE PROCEDURE     2020 took  out head of pancreas, gallbladder, part of stomach, bile ducts   Allergies  Allergen Reactions   Aspirin    Benadryl [Diphenhydramine Hcl]    Penicillins    Shellfish-Derived Products       Objective:    Physical Exam Vitals and nursing note reviewed.  Constitutional:      Appearance: Normal appearance. She is ill-appearing.     Interventions: Face mask in place.  HENT:     Right Ear: Tympanic membrane and ear canal normal.     Left Ear: Tympanic membrane and ear canal normal.     Nose:     Right Sinus: No frontal sinus tenderness.     Left Sinus: No frontal sinus tenderness.     Mouth/Throat:     Mouth: Mucous membranes are moist.     Pharynx: Posterior oropharyngeal erythema (mild) present. No pharyngeal swelling, oropharyngeal exudate or uvula swelling.     Tonsils: No tonsillar exudate or tonsillar abscesses.  Cardiovascular:     Rate and Rhythm: Normal rate and regular rhythm.  Pulmonary:     Effort: Pulmonary effort is normal.     Breath sounds: Normal breath sounds.  Musculoskeletal:        General: Normal range of motion.  Lymphadenopathy:     Head:     Right side of head: No preauricular or posterior auricular adenopathy.  Left side of head: No preauricular or posterior auricular adenopathy.     Cervical: No cervical adenopathy.  Skin:    General: Skin is warm and dry.  Neurological:     Mental Status: She is alert.  Psychiatric:        Mood and Affect: Mood normal.        Behavior: Behavior normal.    BP 116/62 (BP Location: Left Arm, Patient Position: Sitting, Cuff Size: Large)   Pulse 69   Temp 98.2 F (36.8 C) (Temporal)   Ht '5\' 4"'$  (1.626 m)   Wt 149 lb 2 oz (67.6 kg)   LMP  (LMP Unknown)   SpO2 99%   BMI 25.60 kg/m  Wt Readings from Last 3 Encounters:  10/14/22 149 lb 2 oz (67.6 kg)  02/15/22 147 lb 12.8 oz (67 kg)  09/25/11 148 lb (67.1 kg)       Jeanie Sewer, NP

## 2022-10-15 ENCOUNTER — Encounter: Payer: Self-pay | Admitting: Family

## 2022-10-15 DIAGNOSIS — R112 Nausea with vomiting, unspecified: Secondary | ICD-10-CM

## 2022-10-15 MED ORDER — ONDANSETRON HCL 4 MG PO TABS: 4.0000 mg | ORAL_TABLET | Freq: Three times a day (TID) | ORAL | 0 refills | Status: AC | PRN

## 2022-10-31 ENCOUNTER — Encounter: Payer: Self-pay | Admitting: *Deleted

## 2022-11-29 ENCOUNTER — Telehealth (INDEPENDENT_AMBULATORY_CARE_PROVIDER_SITE_OTHER): Payer: BC Managed Care – PPO | Admitting: Physician Assistant

## 2022-11-29 VITALS — Ht 64.0 in | Wt 149.0 lb

## 2022-11-29 DIAGNOSIS — Z20828 Contact with and (suspected) exposure to other viral communicable diseases: Secondary | ICD-10-CM

## 2022-11-29 NOTE — Progress Notes (Signed)
   Virtual Visit via Video Note  I connected with  Barbara Rodgers  on 11/29/22 at  8:30 AM EST by a video enabled telemedicine application and verified that I am speaking with the correct person using two identifiers.  Location: Patient: home Provider: Therapist, music at [place] present: Patient and myself   I discussed the limitations of evaluation and management by telemedicine and the availability of in person appointments. The patient expressed understanding and agreed to proceed.   History of Present Illness:  37 yo female presents for virtual visit to discuss concerns about possible herpes exposure. Brother's GF delivered a baby girl yesterday, hx of genital herpes, on Valtrex. Patient states she may have had contact with fluid from the baby, wasn't wearing gloves because everything happened so quickly.  No personal hx of STIs. No lesions or symptoms.     Observations/Objective:   Gen: Awake, alert, no acute distress Resp: Breathing is even and non-labored Psych: calm/pleasant demeanor Neuro: Alert and Oriented x 3, + facial symmetry, speech is clear.   Assessment and Plan:  1. Herpes exposure Reassured patient very small chance she could have contracted herpes during encounter yesterday. For reassurance, will check blood work in a few weeks. Lab order placed. She will let me know sooner if any symptoms arise or any new concerns. Congratulated her on her new niece.    Follow Up Instructions:    I discussed the assessment and treatment plan with the patient. The patient was provided an opportunity to ask questions and all were answered. The patient agreed with the plan and demonstrated an understanding of the instructions.   The patient was advised to call back or seek an in-person evaluation if the symptoms worsen or if the condition fails to improve as anticipated.  Anaclara Acklin M Jette Lewan, PA-C

## 2023-04-08 NOTE — Progress Notes (Deleted)
   Acute Office Visit  Subjective:    Patient ID: Barbara Rodgers, female    DOB: Aug 25, 1986, 37 y.o.   MRN: [id number]   HPI 37 y.o. presents as new patient for pelvic pain. H/O whipple procedure for pancreatic cancer in 2020.  No LMP recorded.    Review of Systems     Objective:    Physical Exam  There were no vitals taken for this visit. Wt Readings from Last 3 Encounters:  11/29/22 149 lb (67.6 kg)  10/14/22 149 lb 2 oz (67.6 kg)  02/15/22 147 lb 12.8 oz (67 kg)        Patient informed chaperone available to be present for breast and/or pelvic exam. Patient has requested no chaperone to be present. Patient has been advised what will be completed during breast and pelvic exam.   Assessment & Plan:   Problem List Items Addressed This Visit   None       Olivia Mackie DNP, 4:09 PM 04/08/2023

## 2023-04-09 ENCOUNTER — Encounter: Payer: Self-pay | Admitting: Nurse Practitioner

## 2023-09-02 ENCOUNTER — Encounter (HOSPITAL_COMMUNITY): Payer: Self-pay

## 2023-09-02 ENCOUNTER — Emergency Department (HOSPITAL_COMMUNITY): Payer: BC Managed Care – PPO

## 2023-09-02 ENCOUNTER — Emergency Department (HOSPITAL_COMMUNITY)
Admission: EM | Admit: 2023-09-02 | Discharge: 2023-09-02 | Disposition: A | Discharge status: Home / Self Care | Payer: BC Managed Care – PPO | Attending: Emergency Medicine | Admitting: Emergency Medicine

## 2023-09-02 ENCOUNTER — Other Ambulatory Visit: Payer: Self-pay

## 2023-09-02 DIAGNOSIS — R42 Dizziness and giddiness: Secondary | ICD-10-CM | POA: Diagnosis present

## 2023-09-02 DIAGNOSIS — Z8507 Personal history of malignant neoplasm of pancreas: Secondary | ICD-10-CM | POA: Diagnosis not present

## 2023-09-02 LAB — CBC WITH DIFFERENTIAL/PLATELET
Abs Immature Granulocytes: 0.01 10*3/uL (ref 0.00–0.07)
Basophils Absolute: 0.1 10*3/uL (ref 0.0–0.1)
Basophils Relative: 1 %
Eosinophils Absolute: 0.2 10*3/uL (ref 0.0–0.5)
Eosinophils Relative: 4 %
HCT: 40.8 % (ref 36.0–46.0)
Hemoglobin: 12.6 g/dL (ref 12.0–15.0)
Immature Granulocytes: 0 %
Lymphocytes Relative: 39 %
Lymphs Abs: 2.2 10*3/uL (ref 0.7–4.0)
MCH: 25.7 pg — ABNORMAL LOW (ref 26.0–34.0)
MCHC: 30.9 g/dL (ref 30.0–36.0)
MCV: 83.1 fL (ref 80.0–100.0)
Monocytes Absolute: 0.7 10*3/uL (ref 0.1–1.0)
Monocytes Relative: 12 %
Neutro Abs: 2.5 10*3/uL (ref 1.7–7.7)
Neutrophils Relative %: 44 %
Platelets: 299 10*3/uL (ref 150–400)
RBC: 4.91 MIL/uL (ref 3.87–5.11)
RDW: 15.4 % (ref 11.5–15.5)
WBC: 5.6 10*3/uL (ref 4.0–10.5)
nRBC: 0 % (ref 0.0–0.2)

## 2023-09-02 LAB — COMPREHENSIVE METABOLIC PANEL
ALT: 27 U/L (ref 0–44)
AST: 40 U/L (ref 15–41)
Albumin: 3.9 g/dL (ref 3.5–5.0)
Alkaline Phosphatase: 93 U/L (ref 38–126)
Anion gap: 6 (ref 5–15)
BUN: 13 mg/dL (ref 6–20)
CO2: 24 mmol/L (ref 22–32)
Calcium: 8.8 mg/dL — ABNORMAL LOW (ref 8.9–10.3)
Chloride: 105 mmol/L (ref 98–111)
Creatinine, Ser: 0.55 mg/dL (ref 0.44–1.00)
GFR, Estimated: >60 mL/min (ref >60)
Glucose, Bld: 102 mg/dL — ABNORMAL HIGH (ref 70–99)
Potassium: 4 mmol/L (ref 3.5–5.1)
Sodium: 135 mmol/L (ref 135–145)
Total Bilirubin: 0.5 mg/dL (ref 0.3–1.2)
Total Protein: 7.5 g/dL (ref 6.5–8.1)

## 2023-09-02 LAB — CK: Total CK: 459 U/L — ABNORMAL HIGH (ref 38–234)

## 2023-09-02 MED ORDER — IOHEXOL 350 MG/ML SOLN
75.0000 mL | Freq: Once | INTRAVENOUS | Status: AC | PRN
  Administered 2023-09-02: 75 mL via INTRAVENOUS

## 2023-09-02 MED ORDER — MECLIZINE HCL 25 MG PO TABS: 25.0000 mg | ORAL_TABLET | Freq: Three times a day (TID) | ORAL | 0 refills | Status: AC | PRN

## 2023-09-02 MED ORDER — MECLIZINE HCL 25 MG PO TABS
25.0000 mg | ORAL_TABLET | Freq: Once | ORAL | Status: AC
  Administered 2023-09-02: 25 mg via ORAL

## 2023-09-02 MED ORDER — MECLIZINE HCL 25 MG PO TABS
50.0000 mg | ORAL_TABLET | Freq: Once | ORAL | Status: AC
  Administered 2023-09-02: 50 mg via ORAL
  Filled 2023-09-02: qty 2

## 2023-09-02 MED ORDER — MECLIZINE HCL 25 MG PO TABS
ORAL_TABLET | ORAL | Status: AC
  Filled 2023-09-02: qty 1

## 2023-09-02 NOTE — ED Notes (Signed)
During ortho VS, pt was okay during the lying down portion. When initially sitting the pt up they became extremely light-headed, it went away after a few second and came back as soon as the pt was about stand up. Pt remained light headed and unstable throughout the standing portion and was also weak.

## 2023-09-02 NOTE — ED Provider Notes (Signed)
[place] EMERGENCY DEPARTMENT AT [facility] Provider Note   CSN: [id number] Arrival date & time: 09/02/23  1410     History  Chief Complaint  Patient presents with   Abnormal Lab    Barbara Rodgers is a 37 y.o. female.  HPI    37 year old female with previous history of pancreatic cancer status post Whipple, currently in remission comes in with chief complaint of dizziness and abnormal lab.  Patient states that she ran a half marathon on Saturday, 3 days ago.  Today she had gone to a jujitsu workout.  Yesterday, she was feeling crampy and weak, but decided to power through and go to the workout.  In the middle of the workout she noted that she was feeling dizzy and lightheaded.  Dizziness is also described as spinning sensation.  Patient felt like she was going to faint and she was seeing spots.  She did not lose consciousness, and was able to gather herself.  She proceeded to drive to her home.  Once she got out of her car, and started walking to her home she started having significant dizziness again described as spinning sensation.  She was able to get to her bedroom, raised her leg and her family checked her blood sugar and blood glucose which were normal.  Her symptoms improved but did not resolve.  She thought perhaps she needed to eat more protein and had a hearty meal but her symptoms have continued.  Therefore she went to the urgent care today.  At the urgent care, they diagnosed her with vertigo but also ordered CK which was over 400 and she was advised to come to the ER.  Currently patient states that she feels better when she is laying still.  Upon any movement, she starts having spinning sensation.  Patient denies any associated dysarthria, dysphagia, diplopia and she has no one-sided weakness or numbness.  Patient has had previous vertigo, but it was because of COVID.  Home Medications Prior to Admission medications   Medication Sig Start Date End Date Taking?  Authorizing Provider  meclizine (ANTIVERT) 25 MG tablet Take 1 tablet (25 mg total) by mouth 3 (three) times daily as needed for dizziness. 09/02/23  Yes Zorian Gunderman, MD  clonazePAM (KLONOPIN) 0.5 MG tablet Take 0.5 mg by mouth daily. 11/19/22   [provider]  ondansetron (ZOFRAN) 4 MG tablet Take 1 tablet (4 mg total) by mouth every 8 (eight) hours as needed for nausea or vomiting. 10/15/22   Dulce Sellar, NP  propranolol (INDERAL) 10 MG tablet Take 10 mg by mouth as needed.    [provider]      Allergies    Aspirin, Benadryl [diphenhydramine hcl], Penicillins, and Shellfish-derived products    Review of Systems   Review of Systems  All other systems reviewed and are negative.   Physical Exam Updated Vital Signs BP 123/77   Pulse 64   Temp 98.5 F (36.9 C) (Oral)   Resp 20   SpO2 100%  Physical Exam Vitals and nursing note reviewed.  Constitutional:      Appearance: She is well-developed.  HENT:     Head: Atraumatic.  Eyes:     Extraocular Movements: Extraocular movements intact.     Pupils: Pupils are equal, round, and reactive to light.     Comments: No nystagmus  Cardiovascular:     Rate and Rhythm: Normal rate.  Pulmonary:     Effort: Pulmonary effort is normal.  Musculoskeletal:  Cervical back: Normal range of motion and neck supple.  Skin:    General: Skin is warm and dry.  Neurological:     Mental Status: She is alert and oriented to person, place, and time.     Cranial Nerves: No cranial nerve deficit.     Sensory: No sensory deficit.     Motor: No weakness.     Coordination: Coordination normal.     Comments: Finger-to-nose reveals no dysmetria     ED Results / Procedures / Treatments   Labs (all labs ordered are listed, but only abnormal results are displayed) Labs Reviewed  CBC WITH DIFFERENTIAL/PLATELET - Abnormal; Notable for the following components:      Result Value   MCH 25.7 (*)    All other components  within normal limits  COMPREHENSIVE METABOLIC PANEL - Abnormal; Notable for the following components:   Glucose, Bld 102 (*)    Calcium 8.8 (*)    All other components within normal limits  CK - Abnormal; Notable for the following components:   Total CK 459 (*)    All other components within normal limits    EKG None  Radiology CT ANGIO HEAD NECK W WO CM  Result Date: 09/02/2023 CLINICAL DATA:  Vertebral artery dissection suspected, lightheaded EXAM: CT ANGIOGRAPHY HEAD AND NECK WITH AND WITHOUT CONTRAST TECHNIQUE: Multidetector CT imaging of the head and neck was performed using the standard protocol during bolus administration of intravenous contrast. Multiplanar CT image reconstructions and MIPs were obtained to evaluate the vascular anatomy. Carotid stenosis measurements (when applicable) are obtained utilizing NASCET criteria, using the distal internal carotid diameter as the denominator. RADIATION DOSE REDUCTION: This exam was performed according to the departmental dose-optimization program which includes automated exposure control, adjustment of the mA and/or kV according to patient size and/or use of iterative reconstruction technique. CONTRAST:  75mL OMNIPAQUE IOHEXOL 350 MG/ML SOLN COMPARISON:  None Available. FINDINGS: CT HEAD FINDINGS Brain: No evidence of acute infarct, hemorrhage, mass, mass effect, or midline shift. No hydrocephalus or extra-axial fluid collection. Partial empty sella. Normal craniocervical junction. Vascular: No hyperdense vessel. Skull: Negative for fracture or focal lesion. Sinuses/Orbits: No acute finding. Other: The mastoid air cells are well aerated. CTA NECK FINDINGS Aortic arch: Standard branching. Imaged portion shows no evidence of aneurysm or dissection. No significant stenosis of the major arch vessel origins. Right carotid system: No evidence of stenosis, dissection, or occlusion. Left carotid system: No evidence of stenosis, dissection, or occlusion.  Vertebral arteries: No evidence of stenosis, dissection, or occlusion. Skeleton: No acute osseous abnormality. Degenerative changes in the cervical spine. Other neck: No acute finding. Upper chest: No focal pulmonary opacity or pleural effusion. Review of the MIP images confirms the above findings CTA HEAD FINDINGS Anterior circulation: Both internal carotid arteries are patent to the termini, without significant stenosis. A1 segments patent. X shaped anterior communicating artery, a normal variant. Anterior cerebral arteries are patent to their distal aspects without significant stenosis. No M1 stenosis or occlusion. MCA branches perfused to their distal aspects without significant stenosis. Posterior circulation: Vertebral arteries patent to the vertebrobasilar junction without significant stenosis. Posterior inferior cerebellar arteries patent proximally. Basilar patent to its distal aspect without significant stenosis. Superior cerebellar arteries patent proximally. Patent P1 segments. PCAs perfused to their distal aspects without significant stenosis. The bilateral posterior communicating arteries are diminutive but patent. Venous sinuses: As permitted by contrast timing, patent. Anatomic variants: X shaped anterior communicating artery. No evidence of aneurysm or vascular malformation.  Review of the MIP images confirms the above findings IMPRESSION: 1. No acute intracranial process. 2. No intracranial large vessel occlusion or significant stenosis. 3. No hemodynamically significant stenosis in the neck. Electronically Signed   By: Wiliam Ke M.D.   On: 09/02/2023 20:56    Procedures Procedures    Medications Ordered in ED Medications  meclizine (ANTIVERT) tablet 50 mg (50 mg Oral Given 09/02/23 1703)  iohexol (OMNIPAQUE) 350 MG/ML injection 75 mL (75 mLs Intravenous Contrast Given 09/02/23 2002)    ED Course/ Medical Decision Making/ A&P Clinical Course as of 09/02/23 2146  Tue Sep 02, 2023   1950 Still having dizziness. Agrees to getting CT-A.  [AN]    Clinical Course User Index [AN] Derwood Kaplan, MD                                 Medical Decision Making Amount and/or Complexity of Data Reviewed Labs: ordered. Radiology: ordered.  Risk Prescription drug management.   This patient presents to the ED with chief complaint(s) of dizziness with pertinent past medical history of whipple procedure for pancreatic CA. Pt had elevated CK at the urgent care. The complaint involves an extensive differential diagnosis and also carries with it a high risk of complications and morbidity.   Patient has CK of 400, which is not clinically rhabdomyolysis.  She ran a half marathon 3 days ago and had a workout yesterday, I do not think she has rhabdo clinically.  The differential diagnosis includes : DDx includes: Central vertigo:  Tumor  Stroke  ICH  Vertebrobasilar TIA  Peripheral Vertigo:  BPPV  Vestibular neuritis  Meniere disease  Migrainous vertigo  Ear Infection   The initial plan is to give patient meclizine, start oral hydration and reassess.  Clinically patient does not have any risk factors for central cause for vertigo -except for the fact that she does    Additional history obtained: Records reviewed Care Everywhere/External Records. Urgent care records.  Independent labs interpretation:  The following labs were independently interpreted: CK is 459, consistent with a CK patient had at the urgent care. Patient's UA did not show any hemoglobin, further reducing the chance that this is rhabdo.  CBC CMP that were ordered in triage are normal.  Treatment and Reassessment: Continue to feel unwell therefore CT angiogram ordered.  I independently interpreted patient's CT angiogram.  There is no evidence of brain bleed.  No dissection noted.  The patient appears reasonably screened and/or stabilized for discharge and I doubt any other medical condition or other  [facility] requiring further screening, evaluation, or treatment in the ED at this time prior to discharge.   Results from the ER workup discussed with the patient face to face and all questions answered to the best of my ability. The patient is safe for discharge with strict return precautions.   Final Clinical Impression(s) / ED Diagnoses Final diagnoses:  Vertigo    Rx / DC Orders ED Discharge Orders          Ordered    meclizine (ANTIVERT) 25 MG tablet  3 times daily PRN        09/02/23 2143              Derwood Kaplan, MD 09/02/23 2146

## 2023-09-02 NOTE — ED Notes (Signed)
Pt had been drinking gatorade without issue. Pt took pills and drank water without nausea. PO challenge passed.

## 2023-09-02 NOTE — Discharge Instructions (Addendum)
The CT angiogram is negative for any evidence of vascular injury. At this time, we suspect that you have peripheral vertigo.  Please start the Epley maneuver as prescribed start taking the medications that are prescribed. Please follow-up with your primary care doctor in 7 to 10 days if symptoms are not improving.

## 2023-09-02 NOTE — ED Triage Notes (Signed)
Seen at [facility] today and sent here for abnormal labs concerning for Rhabdo.

## 2023-12-31 IMAGING — CT CT ABD-PELV W/ CM
2 of 4 series · 15 of 46 positions shown, 17 images · IV contrast (Omnipaque or Isovue)
Comparison: None.

CLINICAL DATA: Left lower quadrant abdominal pain for 4 days,
intermittent nausea

EXAM:
CT ABDOMEN AND PELVIS WITH CONTRAST
TECHNIQUE: Multidetector CT imaging of the abdomen and pelvis was performed
using the standard protocol following bolus administration of
intravenous contrast.

[Series 2: axial st · axial · 0.83mm/px · z∈[+874,+1290]mm · 12 of 95 slices shown, 14 images]
[im 8/95  soft-tissue]
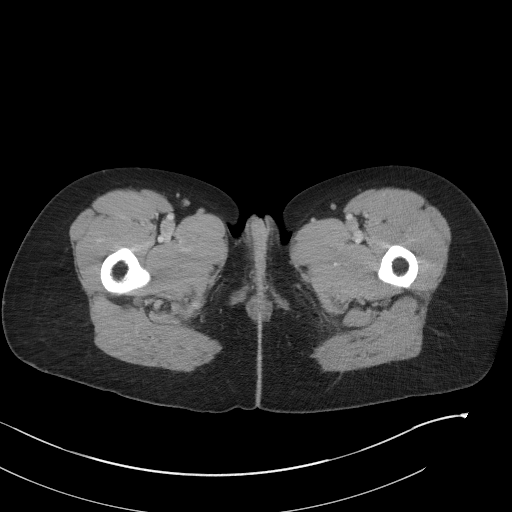
[im 8/95  bone]
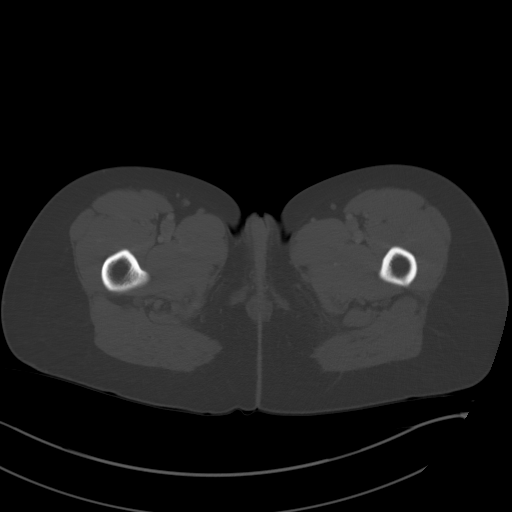
[im 16/95  soft-tissue]
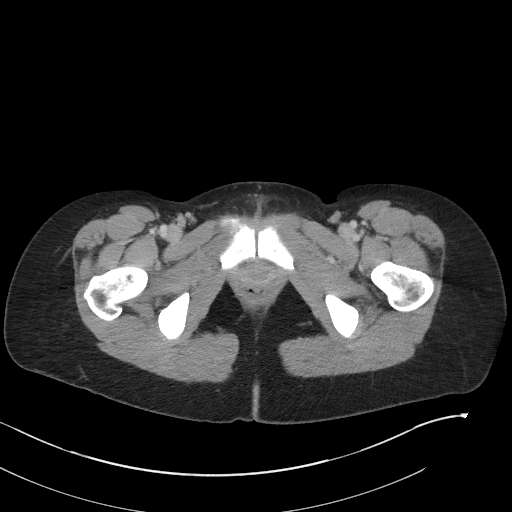
[im 23/95  soft-tissue]
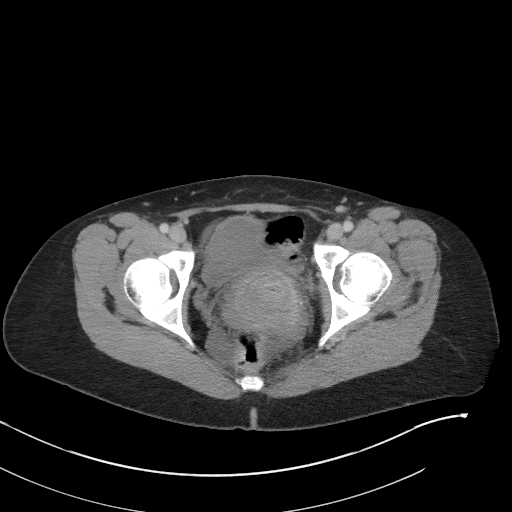
[im 31/95  soft-tissue]
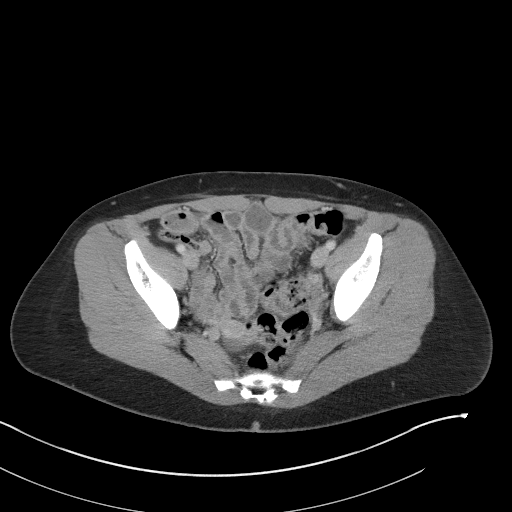
[im 38/95  soft-tissue]
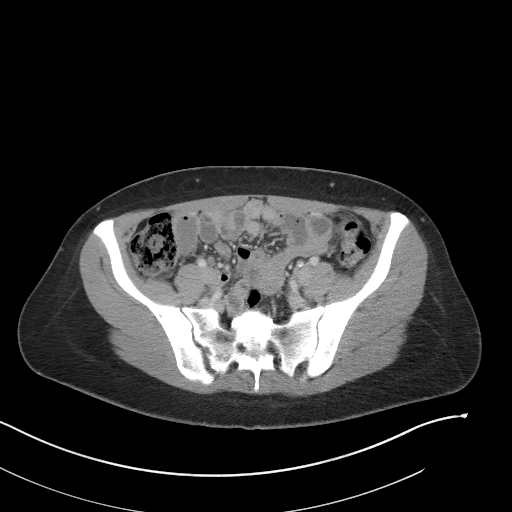
[im 46/95  soft-tissue]
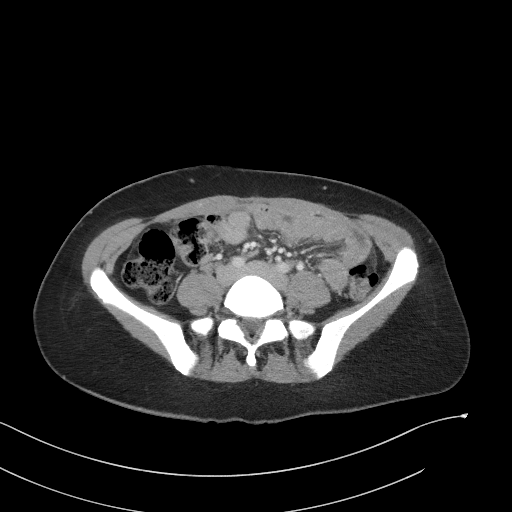
[im 53/95  soft-tissue]
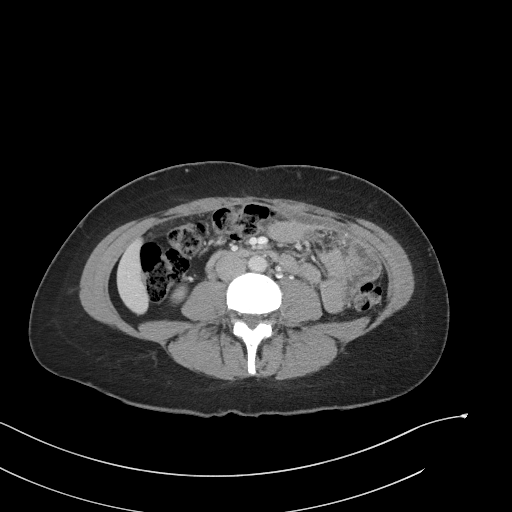
[im 61/95  soft-tissue]
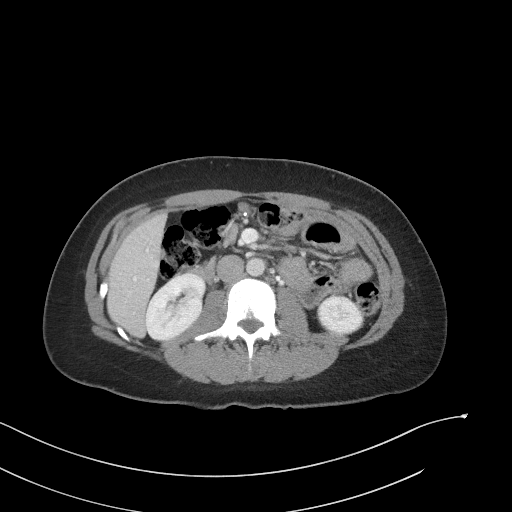
[im 68/95  soft-tissue]
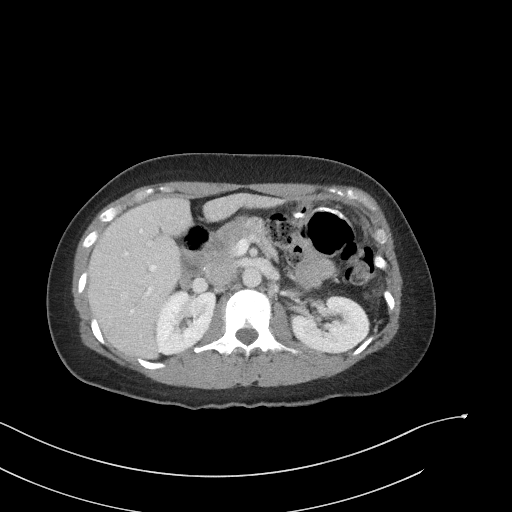
[im 68/95  bone]
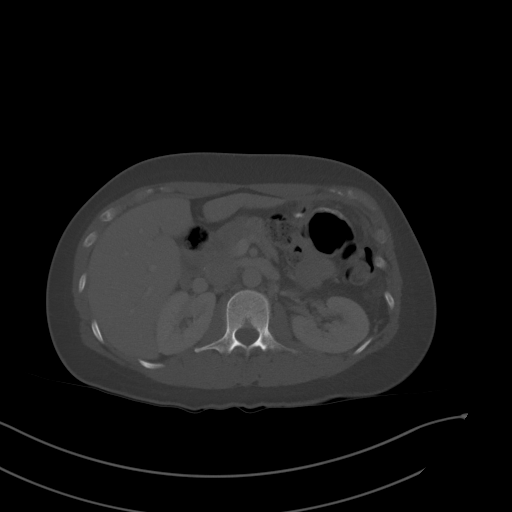
[im 76/95  soft-tissue]
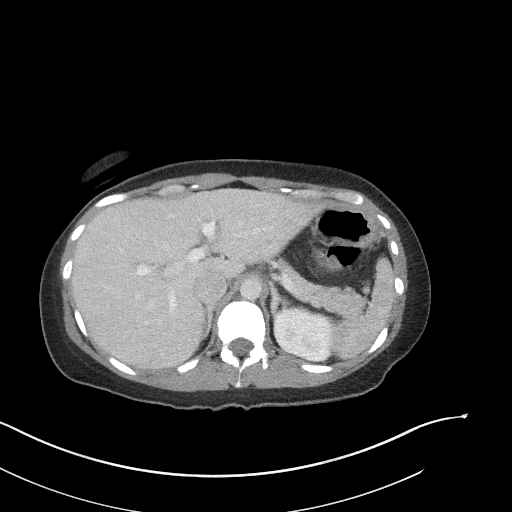
[im 83/95  soft-tissue]
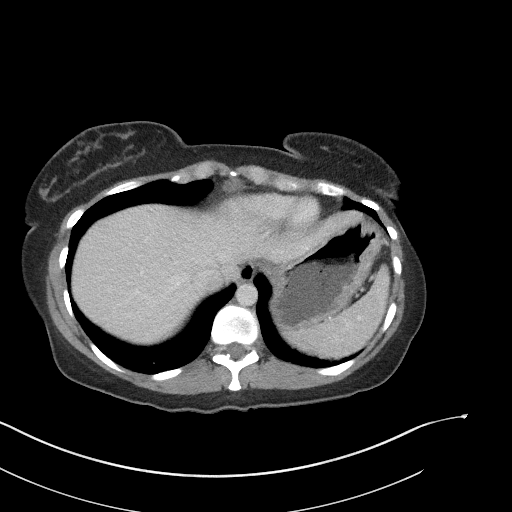
[im 91/95  soft-tissue]
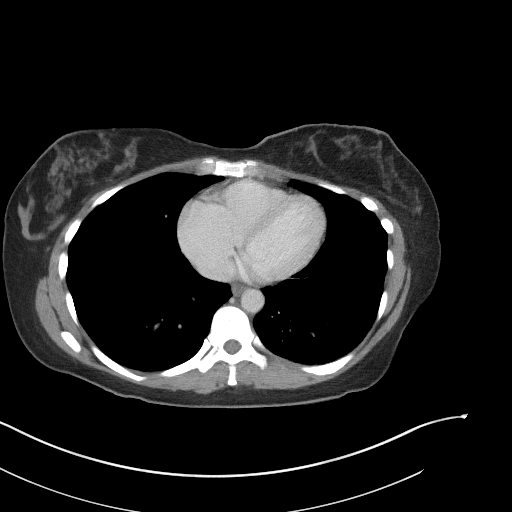

[Series 5: coronal st · coronal · 0.71mm/px · 3 of 86 slices shown]
[im 29/86  soft-tissue]
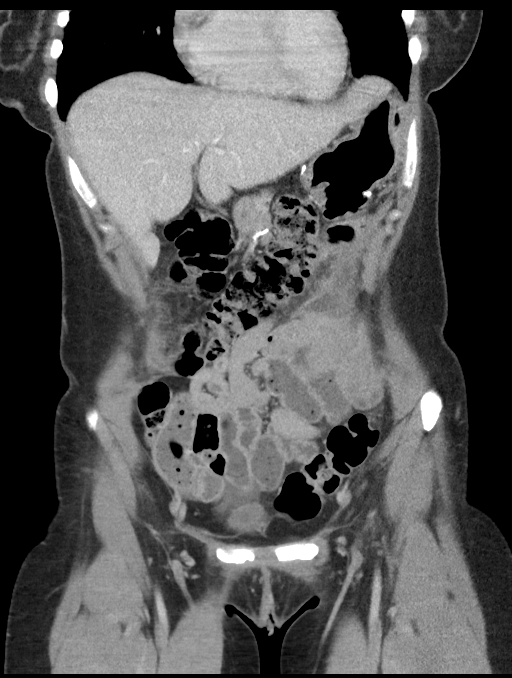
[im 38/86  soft-tissue]
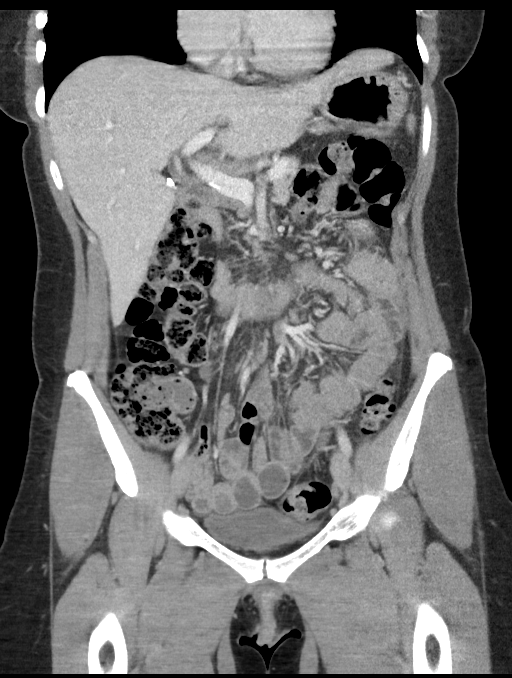
[im 48/86  soft-tissue]
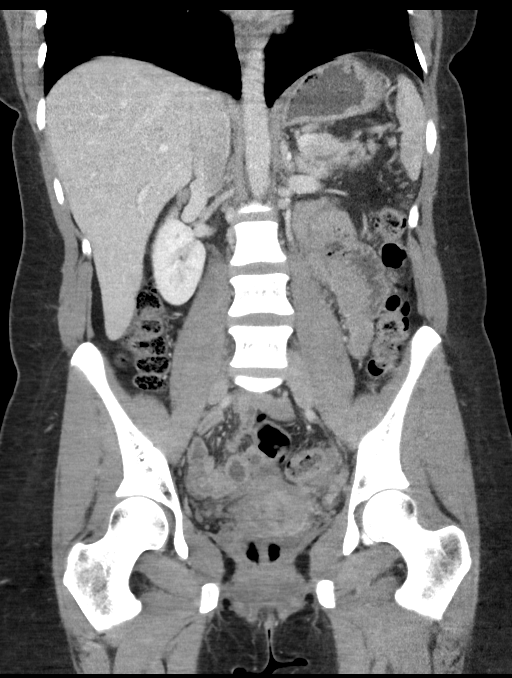

[15 of 46 positions shown; findings below may reference images not displayed]

RADIATION DOSE REDUCTION: This exam was performed according to the
departmental dose-optimization program which includes automated
exposure control, adjustment of the mA and/or kV according to
patient size and/or use of iterative reconstruction technique.

CONTRAST:  100mL OMNIPAQUE IOHEXOL 300 MG/ML  SOLN
FINDINGS: Lower chest: No acute pleural or parenchymal lung disease.

Hepatobiliary: Cholecystectomy. The liver is unremarkable. No
biliary duct dilation.

Pancreas: Previous pancreatic head resection. Remaining portions of
the pancreas are unremarkable. No duct dilation.

Spleen: Normal in size without focal abnormality.

Adrenals/Urinary Tract: The kidneys enhance normally and
symmetrically. No urinary tract calculi or obstructive uropathy. The
adrenals are unremarkable. Bladder is decompressed, limiting its
evaluation.

Stomach/Bowel: Postsurgical changes from distal gastrectomy and
duodenectomy. Gastrojejunostomy left upper quadrant. No bowel
obstruction or ileus. Normal appendix right lower quadrant. No bowel
wall thickening or inflammatory change.

Vascular/Lymphatic: No significant vascular findings are present. No
enlarged abdominal or pelvic lymph nodes.

Reproductive: Uterus is retroverted.  No adnexal masses.

Other: There is a small amount of free fluid within the lower
pelvis, which may be physiologic. No free intraperitoneal gas.

Evidence of prior ventral hernia repair at the level the umbilicus
and left mid abdomen, with likely mesh within the ventral left
hemiabdomen. Minimal fluid along the ventral [place], measuring 7 mm
reference image 43/2. Small amount of simple fluid at the level of
the umbilicus measures 1.4 x 1.8 cm. No rim enhancement to suggest
abscess.

Musculoskeletal: No acute or destructive bony lesions. Reconstructed
images demonstrate no additional findings.
IMPRESSION: 1. Small amount of pelvic free fluid, likely physiologic.
2. Evidence of prior ventral/umbilical hernia repair, with apparent
surgical mesh in place. A small amount of simple appearing fluid
along the [place] and at the level the umbilicus. No rim enhancement to
suggest abscess.
3. Postsurgical changes from cholecystectomy, distal gastrectomy,
duodenectomy, and partial pancreatectomy.

## 2024-06-25 NOTE — Progress Notes (Signed)
 RD consult requested.

## 2024-06-28 NOTE — Progress Notes (Signed)
 Labs requested

## 2024-07-26 ENCOUNTER — Emergency Department

## 2024-07-26 ENCOUNTER — Telehealth: Payer: Self-pay | Admitting: Emergency Medicine

## 2024-07-26 ENCOUNTER — Emergency Department
Admission: EM | Admit: 2024-07-26 | Discharge: 2024-07-26 | Disposition: A | Discharge status: Home / Self Care | Source: Ambulatory Visit | Attending: Emergency Medicine | Admitting: Emergency Medicine

## 2024-07-26 ENCOUNTER — Other Ambulatory Visit: Payer: Self-pay

## 2024-07-26 DIAGNOSIS — Z8507 Personal history of malignant neoplasm of pancreas: Secondary | ICD-10-CM | POA: Insufficient documentation

## 2024-07-26 DIAGNOSIS — R1084 Generalized abdominal pain: Secondary | ICD-10-CM | POA: Insufficient documentation

## 2024-07-26 DIAGNOSIS — R509 Fever, unspecified: Secondary | ICD-10-CM | POA: Insufficient documentation

## 2024-07-26 DIAGNOSIS — R112 Nausea with vomiting, unspecified: Secondary | ICD-10-CM | POA: Diagnosis present

## 2024-07-26 DIAGNOSIS — R197 Diarrhea, unspecified: Secondary | ICD-10-CM | POA: Insufficient documentation

## 2024-07-26 LAB — CBC
HCT: 45.7 % (ref 36.0–46.0)
Hemoglobin: 15.9 g/dL — ABNORMAL HIGH (ref 12.0–15.0)
MCH: 29.9 pg (ref 26.0–34.0)
MCHC: 34.8 g/dL (ref 30.0–36.0)
MCV: 85.9 fL (ref 80.0–100.0)
Platelets: 251 K/uL (ref 150–400)
RBC: 5.32 MIL/uL — ABNORMAL HIGH (ref 3.87–5.11)
RDW: 12.5 % (ref 11.5–15.5)
WBC: 3.8 K/uL — ABNORMAL LOW (ref 4.0–10.5)
nRBC: 0 % (ref 0.0–0.2)

## 2024-07-26 LAB — COMPREHENSIVE METABOLIC PANEL WITH GFR
ALT: 42 U/L (ref 0–44)
AST: 44 U/L — ABNORMAL HIGH (ref 15–41)
Albumin: 4.2 g/dL (ref 3.5–5.0)
Alkaline Phosphatase: 112 U/L (ref 38–126)
Anion gap: 10 (ref 5–15)
BUN: 9 mg/dL (ref 6–20)
CO2: 20 mmol/L — ABNORMAL LOW (ref 22–32)
Calcium: 9.4 mg/dL (ref 8.9–10.3)
Chloride: 104 mmol/L (ref 98–111)
Creatinine, Ser: 0.52 mg/dL (ref 0.44–1.00)
GFR, Estimated: >60 mL/min (ref >60)
Glucose, Bld: 95 mg/dL (ref 70–99)
Potassium: 4 mmol/L (ref 3.5–5.1)
Sodium: 134 mmol/L — ABNORMAL LOW (ref 135–145)
Total Bilirubin: 0.9 mg/dL (ref 0.0–1.2)
Total Protein: 7.7 g/dL (ref 6.5–8.1)

## 2024-07-26 LAB — URINALYSIS, ROUTINE W REFLEX MICROSCOPIC
Bilirubin Urine: NEGATIVE
Glucose, UA: NEGATIVE mg/dL
Hgb urine dipstick: NEGATIVE
Ketones, ur: 20 mg/dL — AB
Leukocytes,Ua: NEGATIVE
Nitrite: NEGATIVE
Protein, ur: NEGATIVE mg/dL
Specific Gravity, Urine: 1.026 (ref 1.005–1.030)
pH: 5 (ref 5.0–8.0)

## 2024-07-26 LAB — IRON AND TIBC
Iron: 34 ug/dL (ref 28–170)
Saturation Ratios: 10 % — ABNORMAL LOW (ref 10.4–31.8)
TIBC: 350 ug/dL (ref 250–450)
UIBC: 316 ug/dL

## 2024-07-26 LAB — FERRITIN: Ferritin: 148 ng/mL (ref 11–307)

## 2024-07-26 LAB — RESP PANEL BY RT-PCR (RSV, FLU A&B, COVID)  RVPGX2
Influenza A by PCR: NEGATIVE
Influenza B by PCR: NEGATIVE
Resp Syncytial Virus by PCR: NEGATIVE
SARS Coronavirus 2 by RT PCR: NEGATIVE

## 2024-07-26 LAB — PREGNANCY, URINE: Preg Test, Ur: NEGATIVE

## 2024-07-26 LAB — LIPASE, BLOOD: Lipase: 25 U/L (ref 11–51)

## 2024-07-26 MED ORDER — ONDANSETRON 4 MG PO TBDP: 4.0000 mg | ORAL_TABLET | Freq: Three times a day (TID) | ORAL | 0 refills | Status: AC | PRN

## 2024-07-26 MED ORDER — SODIUM CHLORIDE 0.9 % IV BOLUS
1000.0000 mL | Freq: Once | INTRAVENOUS | Status: AC
  Administered 2024-07-26: 1000 mL via INTRAVENOUS

## 2024-07-26 MED ORDER — DICYCLOMINE HCL 10 MG PO CAPS: 10.0000 mg | ORAL_CAPSULE | Freq: Three times a day (TID) | ORAL | 0 refills | Status: AC

## 2024-07-26 MED ORDER — IOHEXOL 300 MG/ML  SOLN
100.0000 mL | Freq: Once | INTRAMUSCULAR | Status: AC | PRN
  Administered 2024-07-26: 100 mL via INTRAVENOUS

## 2024-07-26 MED ORDER — ONDANSETRON HCL 4 MG/2ML IJ SOLN
4.0000 mg | Freq: Once | INTRAMUSCULAR | Status: AC
  Administered 2024-07-26: 4 mg via INTRAVENOUS
  Filled 2024-07-26: qty 2

## 2024-07-26 NOTE — ED Provider Notes (Signed)
 [facility] Emergency Department Provider Note     Event Date/Time   First MD Initiated Contact with Patient 07/26/24 1022     (approximate)   History   Abdominal Pain   HPI  Barbara Rodgers is a 38 y.o. female with a past medical history of pancreatic carcinoma s/p whipple procedure presents to the ED for evaluation of generalized abdominal pain that is more prominent in the left quadrants associated with vomiting that began this morning.  Reports fever and diarrhea last night.  She states she took Tylenol with temporary relief but symptoms returned.  Endorses inability to tolerate oral intake.    Also notes localized pain to right antecubital fossa where her blood was drawn a couple of days ago. Radiates up to shoulder. 5/10 pain.  She notes that there was redness but that has resolved. She believes her needle was possibly contaminated.  She also notes prior viral illness approximately 3 weeks ago.     Physical Exam   Triage Vital Signs: ED Triage Vitals  Encounter Vitals Group     BP 07/26/24 0955 (!) 122/92     Girls Systolic BP Percentile --      Girls Diastolic BP Percentile --      Boys Systolic BP Percentile --      Boys Diastolic BP Percentile --      Pulse Rate 07/26/24 0955 82     Resp 07/26/24 0955 18     Temp 07/26/24 0955 98.4 F (36.9 C)     Temp src --      SpO2 07/26/24 0955 100 %     Weight 07/26/24 0955 155 lb (70.3 kg)     Height 07/26/24 0955 5' 4 (1.626 m)     Head Circumference --      Peak Flow --      Pain Score 07/26/24 0954 5     Pain Loc --      Pain Education --      Exclude from Growth Chart --     Most recent vital signs: Vitals:   07/26/24 0955 07/26/24 1355  BP: (!) 122/92 120/88  Pulse: 82 78  Resp: 18 18  Temp: 98.4 F (36.9 C) 98 F (36.7 C)  SpO2: 100% 100%    General: Obvious discomfort. Alert and oriented. INAD.  Skin:  Warm, dry and intact. No rashes or lesions noted.     Head:  NCAT.   Eyes:  PERRLA. EOMI.  CV:  Good peripheral perfusion. RRR. No peripheral edema.  RESP:  Normal effort. LCTAB.  ABD:  No distention. Soft, generalized tenderness. No masses or organomegaly. No CVA tenderness bilaterally.   Other:   Right AC does not reveal swelling, erythema or warmth.  Noted venipuncture scar from previous lab draw.  Neurovascular status intact throughout.  ED Results / Procedures / Treatments   Labs (all labs ordered are listed, but only abnormal results are displayed) Labs Reviewed  COMPREHENSIVE METABOLIC PANEL WITH GFR - Abnormal; Notable for the following components:      Result Value   Sodium 134 (*)    CO2 20 (*)    AST 44 (*)    All other components within normal limits  CBC - Abnormal; Notable for the following components:   WBC 3.8 (*)    RBC 5.32 (*)    Hemoglobin 15.9 (*)    All other components within normal limits  URINALYSIS, ROUTINE W REFLEX MICROSCOPIC - Abnormal;  Notable for the following components:   Color, Urine AMBER (*)    APPearance HAZY (*)    Ketones, ur 20 (*)    All other components within normal limits  IRON AND TIBC - Abnormal; Notable for the following components:   Saturation Ratios 10 (*)    All other components within normal limits  RESP PANEL BY RT-PCR (RSV, FLU A&B, COVID)  RVPGX2  LIPASE, BLOOD  PREGNANCY, URINE  FERRITIN   RADIOLOGY  I personally viewed and evaluated these images as part of my medical decision making, as well as reviewing the written report by the radiologist.  CT ABDOMEN PELVIS W CONTRAST Result Date: 07/26/2024 EXAM: CT ABDOMEN AND PELVIS WITH CONTRAST 07/26/2024 12:37:46 PM TECHNIQUE: CT of the abdomen and pelvis was performed with the administration of intravenous contrast. Multiplanar reformatted images are provided for review. Automated exposure control, iterative reconstruction, and/or weight-based adjustment of the mA/kV was utilized to reduce the radiation dose to as low as reasonably  achievable. COMPARISON: CT of the abdomen and pelvis dated 08/16/2022. CLINICAL HISTORY: Abdominal pain, acute, nonlocalized. Pt states that they started having abd pain at some point over the night/morning that was proceeded by a fever of 101.2 yesterday. Pt took tylenol last night which helped bring the fever down. The abd pain is in the LQU and in the middle of their lower abdomen. Pt has had several bouts of dry heaving and diarrhea since in the ED. FINDINGS: LOWER CHEST: Minimal atelectasis present dependently within the lower lobes bilaterally. LIVER: The liver is unremarkable. GALLBLADDER AND BILE DUCTS: Gallbladder is surgically absent. No biliary ductal dilatation. SPLEEN: No acute abnormality. PANCREAS: The patient is status post Whipple procedure. The gastrojejunal, pancreaticojejunal and choledochojejunal anastomoses are unremarkable. The body and tail of the pancreas are unremarkable. The head and uncinate process have been resected. ADRENAL GLANDS: No acute abnormality. KIDNEYS, URETERS AND BLADDER: No stones in the kidneys or ureters. No hydronephrosis. No perinephric or periureteral stranding. Urinary bladder is unremarkable. GI AND BOWEL: Stomach demonstrates no acute abnormality. There is no bowel obstruction. PERITONEUM AND RETROPERITONEUM: No ascites. No free air. VASCULATURE: Aorta is normal in caliber. LYMPH NODES: There are several shotty mesenteric lymph nodes. REPRODUCTIVE ORGANS: No acute abnormality. BONES AND SOFT TISSUES: No acute osseous abnormality. No focal soft tissue abnormality. IMPRESSION: 1. No acute findings in the abdomen or pelvis. 2. Status post Whipple procedure with unremarkable gastrojejunal, pancreaticojejunostomy, and choledochojejunostomy anastomoses. Electronically signed by: Evalene Coho MD 07/26/2024 01:00 PM EDT RP Workstation: [id number]    PROCEDURES:  Critical Care performed: No  Procedures   MEDICATIONS ORDERED IN ED: Medications  sodium chloride   0.9 % bolus 1,000 mL (0 mLs Intravenous Stopped 07/26/24 1218)  ondansetron  (ZOFRAN ) injection 4 mg (4 mg Intravenous Given 07/26/24 1121)  iohexol  (OMNIPAQUE ) 300 MG/ML solution 100 mL (100 mLs Intravenous Contrast Given 07/26/24 1221)     IMPRESSION / MDM / ASSESSMENT AND PLAN / ED COURSE  I reviewed the triage vital signs and the nursing notes.                              Clinical Course as of 07/26/24 1730  Mon Jul 26, 2024  1059 Declined pain medication [MH]    Clinical Course User Index [MH] Margrette, [place] A, PA-C    38 y.o. female presents to the emergency department for evaluation and treatment of generalized abdominal pain. See HPI for further details.  Differential diagnosis includes, but is not limited to, biliary disease , gastritis, duodenitis, pancreatitis, small bowel or large bowel obstruction   Patient's presentation is most consistent with acute complicated illness / injury requiring diagnostic workup.  Patient with history of pancreatic carcinoma status post Whipple presents with abdominal pain, vomiting, fever and diarrhea.  Physical exam findings are pertinent for mild generalized abdominal tenderness.  Lab work reassuring.  No acute findings on workup.  Symptoms berries present viral gastroenteritis versus nonspecific abdominal pain.  No evidence of surgical abdomen obstruction or sepsis at this time.  Right AC discomfort likely localized irritation from recent venipuncture no signs of infection or cellulitis.  Patient tolerating p.o. challenge in ED supportive care discussed.  ED return precaution discussed. Advised to follow up with PCP in 1 week.   FINAL CLINICAL IMPRESSION(S) / ED DIAGNOSES   Final diagnoses:  Generalized abdominal pain  Nausea vomiting and diarrhea   Rx / DC Orders   ED Discharge Orders          Ordered    ondansetron  (ZOFRAN -ODT) 4 MG disintegrating tablet  Every 8 hours PRN        07/26/24 1345           Note:  This document was  prepared using Dragon voice recognition software and may include unintentional dictation errors.    Margrette, Lamari Beckles A, PA-C 07/26/24 1736    Jossie Artist POUR, MD 07/27/24 [phone]

## 2024-07-26 NOTE — ED Notes (Signed)
 Pt states that they started having abd pain at some point over the night/morning that was proceeded by a fever of 101.2 yesterday. Pt took tylenol last night which helped bring the fever down. The abd pain is in the LQU and in the middle of their lower abdomen. Pt has had several boughts of dry heaving and diarrhea since in the ED.

## 2024-07-26 NOTE — Telephone Encounter (Cosign Needed)
 Requesting medication to be sent to pharmacy for abdominal spasm.  Bentyl  sent to pharmacy.

## 2024-07-26 NOTE — ED Triage Notes (Addendum)
 Pt comes with belly pain and vomiting that started this morning. Pt states right arm pain after blood draw on Friday.   Pt states fever and diarrhea also last night. Pt took tylenol with relief.

## 2024-07-26 NOTE — Discharge Instructions (Addendum)
 Your evaluated in the ED for generalized abdominal pain.  Your lab work is reassuring.  Your urinalysis is normal with the exception of some ketones indicating dehydration.  You received IV fluids.  Your CT abdomen pelvis revealed no acute abnormalities.  Please follow-up with your primary care provider in 1 week for further management.
